# Patient Record
Sex: Female | Born: 1985 | Race: White | Hispanic: No | Marital: Married | Smoking: Former smoker
Health system: Southern US, Community
[De-identification: ages and names within clinical notes are randomized; demographics above are authoritative.]

## PROBLEM LIST (undated history)

## (undated) DIAGNOSIS — Z8614 Personal history of Methicillin resistant Staphylococcus aureus infection: Secondary | ICD-10-CM

## (undated) DIAGNOSIS — F329 Major depressive disorder, single episode, unspecified: Secondary | ICD-10-CM

## (undated) DIAGNOSIS — F32A Depression, unspecified: Secondary | ICD-10-CM

## (undated) DIAGNOSIS — F419 Anxiety disorder, unspecified: Secondary | ICD-10-CM

## (undated) DIAGNOSIS — D649 Anemia, unspecified: Secondary | ICD-10-CM

## (undated) DIAGNOSIS — J45909 Unspecified asthma, uncomplicated: Secondary | ICD-10-CM

## (undated) DIAGNOSIS — K219 Gastro-esophageal reflux disease without esophagitis: Secondary | ICD-10-CM

## (undated) HISTORY — PX: TUBAL LIGATION: SHX77

## (undated) HISTORY — PX: WISDOM TOOTH EXTRACTION: SHX21

---

## 2007-03-16 ENCOUNTER — Observation Stay: Payer: Self-pay | Admitting: Unknown Physician Specialty

## 2007-04-20 ENCOUNTER — Observation Stay: Payer: Self-pay | Admitting: Obstetrics and Gynecology

## 2007-06-23 ENCOUNTER — Observation Stay: Payer: Self-pay | Admitting: Obstetrics & Gynecology

## 2007-06-28 ENCOUNTER — Observation Stay: Payer: Self-pay | Admitting: Obstetrics and Gynecology

## 2007-07-05 ENCOUNTER — Observation Stay: Payer: Self-pay | Admitting: Obstetrics and Gynecology

## 2007-07-06 ENCOUNTER — Inpatient Hospital Stay: Payer: Self-pay | Admitting: Obstetrics and Gynecology

## 2008-02-22 ENCOUNTER — Emergency Department: Payer: Self-pay | Admitting: Emergency Medicine

## 2008-03-31 ENCOUNTER — Emergency Department: Payer: Self-pay | Admitting: Emergency Medicine

## 2009-11-20 ENCOUNTER — Ambulatory Visit: Payer: Self-pay | Admitting: Family Medicine

## 2010-03-19 ENCOUNTER — Observation Stay: Payer: Self-pay | Admitting: Obstetrics & Gynecology

## 2010-06-30 ENCOUNTER — Ambulatory Visit: Payer: Self-pay | Admitting: Obstetrics & Gynecology

## 2010-07-01 ENCOUNTER — Inpatient Hospital Stay: Payer: Self-pay | Admitting: Obstetrics & Gynecology

## 2010-09-15 ENCOUNTER — Ambulatory Visit: Payer: Self-pay | Admitting: Obstetrics & Gynecology

## 2010-09-23 ENCOUNTER — Ambulatory Visit: Payer: Self-pay | Admitting: Obstetrics & Gynecology

## 2017-02-11 ENCOUNTER — Emergency Department
Admission: EM | Admit: 2017-02-11 | Discharge: 2017-02-12 | Disposition: A | Discharge status: Home / Self Care | Payer: Self-pay | Attending: Emergency Medicine | Admitting: Emergency Medicine

## 2017-02-11 DIAGNOSIS — T782XXA Anaphylactic shock, unspecified, initial encounter: Secondary | ICD-10-CM | POA: Insufficient documentation

## 2017-02-11 MED ORDER — EPINEPHRINE 0.3 MG/0.3ML IJ SOAJ
INTRAMUSCULAR | Status: AC
  Filled 2017-02-11: qty 0.3

## 2017-02-11 MED ORDER — METHYLPREDNISOLONE SODIUM SUCC 125 MG IJ SOLR
INTRAMUSCULAR | Status: AC
  Filled 2017-02-11: qty 2

## 2017-02-11 MED ORDER — METHYLPREDNISOLONE SODIUM SUCC 125 MG IJ SOLR
125.0000 mg | Freq: Once | INTRAMUSCULAR | Status: AC
  Administered 2017-02-11: 125 mg via INTRAVENOUS

## 2017-02-11 MED ORDER — FAMOTIDINE IN NACL 20-0.9 MG/50ML-% IV SOLN
INTRAVENOUS | Status: AC
  Filled 2017-02-11: qty 50

## 2017-02-11 MED ORDER — DIPHENHYDRAMINE HCL 50 MG/ML IJ SOLN
INTRAMUSCULAR | Status: AC
  Filled 2017-02-11: qty 1

## 2017-02-11 MED ORDER — EPINEPHRINE 0.3 MG/0.3ML IJ SOAJ
0.3000 mg | Freq: Once | INTRAMUSCULAR | Status: AC
  Administered 2017-02-11: 0.3 mg via INTRAMUSCULAR

## 2017-02-11 MED ORDER — FAMOTIDINE IN NACL 20-0.9 MG/50ML-% IV SOLN
20.0000 mg | Freq: Once | INTRAVENOUS | Status: AC
  Administered 2017-02-11: 20 mg via INTRAVENOUS

## 2017-02-11 MED ORDER — DIPHENHYDRAMINE HCL 50 MG/ML IJ SOLN
25.0000 mg | Freq: Once | INTRAMUSCULAR | Status: AC
  Administered 2017-02-11: 25 mg via INTRAVENOUS

## 2017-02-11 NOTE — ED Triage Notes (Signed)
Pt arrives with redness, hives to entire body, hoarse throat, nausea, lip swelling. Pt immediately to room 6.

## 2017-02-11 NOTE — ED Notes (Signed)
Report to jenna, rn.  

## 2017-02-11 NOTE — ED Provider Notes (Signed)
[facility] Emergency Department Provider Note  ____________________________________________   First MD Initiated Contact with Patient 02/11/17 2256     (approximate)  I have reviewed the triage vital signs and the nursing notes.   HISTORY  Chief Complaint Allergic Reaction    HPI Amy Meza is a 31 y.o. female with no chronic medical history and no known allergies who presents for acute onset of severe generalized itching red rash with a course of voice, sensation of swelling of her tongue and throat, and mild shortness of breath.  The symptoms all started about 30 minutes prior to arrival and have rapidly gotten worse.  She is in moderate distress upon arrival, anxious, scratching her legs, cannot find a position of comfort, and with a clearly abnormal voice and slightly increased respiratory effort.  Nothing in particular makes the patient's symptoms better nor worse.  She had a single capsule of Benadryl by mouth prior to arrival on the way to the emergency department.  She has never had a reaction like this before.  She reports some nausea but no vomiting.  She denies abdominal pain and diarrhea.  She denies recent fever/chills, chest pain, and dysuria.  The symptoms all began when she went to her brother-in-law's house and was sitting on the floor.  The brother-in-law does have dogs, but she has never had a reaction like this before.   No past medical history on file.  There are no active problems to display for this patient.   No past surgical history on file.  Prior to Admission medications   Medication Sig Start Date End Date Taking? Authorizing Provider  EPINEPHrine (EPIPEN 2-PAK) 0.3 mg/0.3 mL IJ SOAJ injection Inject 0.3 mLs (0.3 mg total) into the muscle once. Take for severe allergic reaction, then come immediately to the Emergency Department or call 911. 02/12/17 02/12/17  Loleta Rose, MD  predniSONE (DELTASONE) 20 MG tablet Take 3 tablets  (60 mg total) by mouth daily. 02/12/17   Loleta Rose, MD    Allergies Patient has no allergy information on record.  No family history on file.  Social History Social History  Substance Use Topics  . Smoking status: Not on file  . Smokeless tobacco: Not on file  . Alcohol use Not on file    Review of Systems Constitutional: No fever/chills Eyes: No visual changes. ENT: Hoarse voice, sensation of swelling in her tongue and throat.  Mild difficulty swallowing. Cardiovascular: Denies chest pain. Respiratory: Mild shortness of breath. Gastrointestinal: No abdominal pain.  Nausea, no vomiting.  No diarrhea.  No constipation. Genitourinary: Negative for dysuria. Musculoskeletal: Negative for neck pain.  Negative for back pain. Integumentary: Negative for rash. Neurological: Negative for headaches, focal weakness or numbness.   ____________________________________________   PHYSICAL EXAM:  ED Triage Vitals  Enc Vitals Group     BP 02/11/17 2300 109/65     Pulse Rate 02/11/17 2300 82     Resp 02/11/17 2302 (!) 26     Temp --      Temp src --      SpO2 02/11/17 2300 98 %     Weight --      Height --      Head Circumference --      Peak Flow --      Pain Score --      Pain Loc --      Pain Edu? --      Excl. in GC? --  Constitutional: Alert and oriented. Well appearing and in no acute distress. Eyes: Conjunctivae are normal.  Head: Atraumatic. Nose: No congestion/rhinnorhea. Mouth/Throat: Mucous membranes are moist.  Oropharynx non-erythematous.  No obvious swelling, no oral lesions Neck: No stridor.  No meningeal signs.  Very obviously hoarse voice Cardiovascular: Normal rate, regular rhythm. Good peripheral circulation. Grossly normal heart sounds. Respiratory: Slightly increased respiratory effort.  No retractions. Lungs CTAB. Gastrointestinal: Soft and nontender. No distention.  Musculoskeletal: No lower extremity tenderness nor edema. No gross deformities  of extremities. Neurologic:  Normal speech and language. No gross focal neurologic deficits are appreciated.  Skin:  Generalized bright red rash with urticaria, extremely pruritic, patient is scratching at her legs primarily but the rash is present on all of her extremities and her torso. Psychiatric: Mood and affect are anxious. Speech and behavior are normal.  ____________________________________________   LABS (all labs ordered are listed, but only abnormal results are displayed)  Labs Reviewed - No data to display ____________________________________________  EKG  None - EKG not ordered by ED physician ____________________________________________  RADIOLOGY   No results found.  ____________________________________________   PROCEDURES  Critical Care performed: Yes, see critical care procedure note(s)   Procedure(s) performed:   .Critical Care Performed by: Loleta Rose Authorized by: Loleta Rose   Critical care provider statement:    Critical care time (minutes):  45   Critical care time was exclusive of:  Separately billable procedures and treating other patients   Critical care was necessary to treat or prevent imminent or life-threatening deterioration of the following conditions: anaphylaxis.   Critical care was time spent personally by me on the following activities:  Development of treatment plan with patient or surrogate, discussions with consultants, evaluation of patient's response to treatment, examination of patient, obtaining history from patient or surrogate, ordering and performing treatments and interventions, ordering and review of laboratory studies, ordering and review of radiographic studies, pulse oximetry, re-evaluation of patient's condition and review of old charts      ____________________________________________   INITIAL IMPRESSION / ASSESSMENT AND PLAN / ED COURSE     The patient presents meeting criteria for anaphylaxis.  I  came to the room immediately after I was informed of her presentation and when I heard her voice and description of symptoms I authorized administration of an EpiPen in her left thigh as well as Solu-Medrol 125 mg IV, Benadryl 25 mg IV, and famotidine 20 mg IV.  She was receptive cardiac monitor and pulse oximeter.  Within minutes of getting the EpiPen she started to feel better.  I informed her that we would need to observe her for about 5 hours and to let me know as soon as she started feeling worse.  I will also consider epinephrine nebulizer if her symptoms continue in terms of worsening voice and throat complications.  She is comfortable with the plan.  Differential diagnosis includes infectious causes and foodborne allergens, but this appears to be a fairly clear-cut case of anaphylaxis with an unknown allergen.  Clinical Course as of Feb 13 508  Sat Feb 11, 2017  2300 OBSERVATION CARE: This patient is being placed under observation care for the following reasons: Allergic reaction/anaphylaxis requiring treatment and serial exams to determine response to treatment   [CF]  2328 Patient is much improved with less agitation, less erythema, almost completely resolved urticaria, and improved speech although her voice is still worse compared to baseline according to her husband.  She states that she feels much better  and is no longer scratching at her legs and is resting comfortably.  We will continue to monitor.  I reiterated that we will need to observe her for at least 4-6 hours but more likely on the longer end of the range given the severity and acuity of her reaction.  She and her family understand and agree with the plan.  [CF]  2355 Patient is stable, breathing comfortably, talking with family  [CF]  Sun Feb 12, 2017  0118 Patient continues to feel better.  Breathing comfortably, no additional evidence of anaphylaxis  [CF]  0506 Patient has been resting comfortably.  She has no more rash and no  difficulty breathing.  Voice seems to have returned to normal.  She has been observed for 6 hours in the emergency department and I think it is appropriate to discharge her home at this time.  I gave my usual and customary return precautions and prescriptions for prednisone and EpiPens  [CF]  0507 END OF OBSERVATION STATUS: After an appropriate period of observation, this patient is being discharged due to the following reason(s):  No additional allergic/anaphylactic symptoms    [CF]    Clinical Course User Index [CF] Loleta Rose, MD    ____________________________________________  FINAL CLINICAL IMPRESSION(S) / ED DIAGNOSES  Final diagnoses:  Anaphylaxis, initial encounter     MEDICATIONS GIVEN DURING THIS VISIT:  Medications  diphenhydrAMINE (BENADRYL) injection 25 mg (25 mg Intravenous Given 02/11/17 2300)  methylPREDNISolone sodium succinate (SOLU-MEDROL) 125 mg/2 mL injection 125 mg (125 mg Intravenous Given 02/11/17 2300)  EPINEPHrine (EPI-PEN) injection 0.3 mg (0.3 mg Intramuscular Given 02/11/17 2258)  famotidine (PEPCID) IVPB 20 mg premix (0 mg Intravenous Stopped 02/11/17 2332)     NEW OUTPATIENT MEDICATIONS STARTED DURING THIS VISIT:  New Prescriptions   EPINEPHRINE (EPIPEN 2-PAK) 0.3 MG/0.3 ML IJ SOAJ INJECTION    Inject 0.3 mLs (0.3 mg total) into the muscle once. Take for severe allergic reaction, then come immediately to the Emergency Department or call 911.   PREDNISONE (DELTASONE) 20 MG TABLET    Take 3 tablets (60 mg total) by mouth daily.    Modified Medications   No medications on file    Discontinued Medications   No medications on file     Note:  This document was prepared using Dragon voice recognition software and may include unintentional dictation errors.    Loleta Rose, MD 02/12/17 [id number]

## 2017-02-12 MED ORDER — PREDNISONE 20 MG PO TABS: 60.0000 mg | ORAL_TABLET | Freq: Every day | ORAL | 0 refills | Status: DC

## 2017-02-12 MED ORDER — EPINEPHRINE 0.3 MG/0.3ML IJ SOAJ: 0.3000 mg | Freq: Once | INTRAMUSCULAR | 0 refills | Status: AC

## 2017-02-12 NOTE — ED Notes (Signed)
Patient reports that she was at a friends house and her hands began to itch. Patient then noticed redness to her chest and legs. Pt developed hives on bilateral legs, oral/lip swelling, throat tightness.  Patient denies any known allergens.

## 2017-02-12 NOTE — Discharge Instructions (Signed)

## 2017-02-12 NOTE — ED Notes (Signed)
Reviewed d/c instructions, follow-up care, prescriptions with patient. Pt verbalized understanding.  

## 2017-02-12 NOTE — ED Notes (Signed)
Patient reports only mild itching at this time.

## 2017-09-15 ENCOUNTER — Other Ambulatory Visit: Payer: Self-pay | Admitting: Physician Assistant

## 2017-09-15 DIAGNOSIS — R2231 Localized swelling, mass and lump, right upper limb: Secondary | ICD-10-CM

## 2017-09-18 ENCOUNTER — Ambulatory Visit
Admission: RE | Admit: 2017-09-18 | Discharge: 2017-09-18 | Disposition: A | Discharge status: Home / Self Care | Payer: BLUE CROSS/BLUE SHIELD | Source: Ambulatory Visit | Attending: Physician Assistant | Admitting: Physician Assistant

## 2017-09-18 DIAGNOSIS — R2231 Localized swelling, mass and lump, right upper limb: Secondary | ICD-10-CM | POA: Insufficient documentation

## 2018-06-11 ENCOUNTER — Other Ambulatory Visit: Payer: Self-pay | Admitting: Internal Medicine

## 2018-06-11 DIAGNOSIS — R2231 Localized swelling, mass and lump, right upper limb: Secondary | ICD-10-CM

## 2018-06-12 ENCOUNTER — Ambulatory Visit: Payer: Self-pay | Admitting: General Surgery

## 2018-06-12 NOTE — H&P (Signed)
PATIENT PROFILE: Amy Meza is a 33 y.o. female who presents to the Clinic for consultation at the request of Dr. Judithann SheenSparks for evaluation of right axillary mass.  PCP:  Marguarite ArbourSparks, Jeffrey D, MD  HISTORY OF PRESENT ILLNESS: [name] reports feeling an axillary mass since ~a year ago. The patient refers that the mass is tender upon palpation all the time. But if not touched is not painful. Mass is does painful during the her menstrual cycle. Menstrual cycle aggravates the pain. Pain radiates to the right upper extremity. Pain goes away by itself. Pain medication such as ibuprofen does not improve the pain. Patient refer pain is similar to breast pain during menstrual cycle. She does not has the same pain on the left axilla.   Patient has not have previous biopsy before. Patient has no family history of breast cancer. No other history of cancer in her family. She has had 2 pregnancies. No use of birth control pill or any hormone therapy. No history of chest radiation.    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 Positive for weight gain Allergy and Immunology ROS: negative for - hives  Hematological and Lymphatic ROS: negative for - bleeding problems or bruising, negative for palpable nodes Endocrine ROS: negative for - heat or cold intolerance, hair changes Respiratory ROS: negative for - cough, shortness of breath or wheezing Cardiovascular ROS: positive for chest pain and palpitations GI ROS: negative for nausea, vomiting, abdominal pain, diarrhea, constipation Musculoskeletal ROS: negative for - joint swelling or muscle pain. Pain in  the right upper extremity.  Neurological ROS: negative for - confusion, syncope. Positive for headache Dermatological ROS: negative for pruritus and rash Psychiatric: positive for anxiety, depression, difficulty sleeping and memory loss  MEDICATIONS: CurrentMedications        Current Outpatient Medications  Medication Sig Dispense Refill  . lysine 1,000 mg Tab Take by mouth as needed.     No current facility-administered medications for this visit.       ALLERGIES: Latex; Norco [hydrocodone-acetaminophen]; Others; and Sulfa (sulfonamide antibiotics)  PAST MEDICAL HISTORY:     Past Medical History:  Diagnosis Date  . High cholesterol   . 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 CESAREAN SECTION     x 2  . TUBAL LIGATION       FAMILY HISTORY: Family History  Problem Relation Age of Onset  . High blood pressure (Hypertension) Father   . High blood pressure (Hypertension) Paternal Grandfather   . Coronary Artery Disease (Blocked arteries around heart) Paternal Grandfather   . Diabetes type II Paternal Grandfather      SOCIAL HISTORY: Social History          Socioeconomic History  . Marital status: Married    Spouse name: Not on file  . Number of children: Not on file  . Years of education: Not on file  . Highest education level: Not on file  Occupational History  . Not on file  Social Needs  . Financial resource strain: Not on file  . Food insecurity:    Worry: Not on file    Inability: Not on file  . Transportation needs:    Medical: Not on file    Non-medical: Not on file  Tobacco Use  . Smoking status: Former Smoker    Packs/day: 1.00    Years: 13.00    Pack years: 13.00    Types: Cigarettes  . Smokeless tobacco: Never Used  . Tobacco comment: pt uses e-cig  Substance and Sexual Activity  . Alcohol use: Yes    Alcohol/week: 1.0  standard drinks    Types: 1 Glasses of wine per week  . Drug use: No  . Sexual activity: Not on file  Other Topics Concern  . Not on file  Social History Narrative   Married, lives with husband and daughter, born 2009 and son, born 2012. 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       PHYSICAL EXAM:    Vitals:   06/12/18 1430  BP: 111/70  Pulse: 87   Body mass index is 24.42 kg/m. Weight: 67.6 kg (149 lb 0.5 oz)   GENERAL: Alert, active, oriented x3  HEENT: Pupils equal reactive to light. Extraocular movements are intact. Sclera clear. Palpebral conjunctiva normal red color.Pharynx clear.  NECK: Supple with no palpable mass and no adenopathy.  LUNGS: Sound clear with no rales rhonchi or wheezes.  HEART: Regular rhythm S1 and S2 without murmur.  Breasts: breasts appear normal, no suspicious masses, no skin or nipple changes or axillary nodes. There is a palpable lump in the right axilla, tender to palpation, rubbery, mobile.   ABDOMEN: Soft and depressible, nontender with no palpable mass, no hepatomegaly. Wounds dry and clean.  EXTREMITIES: Well-developed well-nourished symmetrical with no dependent edema.  NEUROLOGICAL: Awake alert oriented, facial expression symmetrical, moving all extremities.  REVIEW OF DATA: I have reviewed the following data today:      No visits with results within 3 Month(s) from this visit.  Latest known visit with results is:  Hospital Outpatient Visit on 02/13/2018  Component Date Value  . Vent Rate (bpm) 02/13/2018 56   . PR Interval (msec) 02/13/2018 150   . QRS Interval (msec) 02/13/2018 96   . QT Interval (msec) 02/13/2018 428   . QTc (msec) 02/13/2018 413      ASSESSMENT: [name] is a 33 y.o. female presenting for consultation for right axillary mass with pain.  Patient with a right axillary mass that feels like lipomatous etiology. Due to the pain that is increased during her menstrual period, it sounds like it is an  ectopic breast tissue. Patient was oriented about the differential diagnosis of benign lipoma vs ectopic breast tissue. I favor breast ectopic tissue to the the pain increased with menstrual periods. Patient oriented about the surgical alternative of removing the lump. The goal of the surgery is to improve her pain. Patient oriented that she can continue with pain if it is associated with the menstrual period, but without a mass, another alternatives of treatment will need to be explored.   Patient risk of having breast cancer in the next five years is 0.2% as per The Breast Cancer Risk Assessment Tool by the [facility].   Patient refers understood and want to proceed with excision of the right axillary mass.   Axillary mass, right [R22.31]  PLAN: 1. Excision of right axillary mass (19120) 2. CBC, CMP 3. Avoid taking aspirin 5 days  before surgery 4. Contact us if has any question or concern.   Patient verbalized understanding, all questions were answered, and were agreeable with the plan outlined above.     Carolan Shiver, MD  Electronically signed by Carolan Shiver, MD

## 2018-06-12 NOTE — H&P (View-Only) (Signed)
PATIENT PROFILE: Amy Meza is a 33 y.o. female who presents to the Clinic for consultation at the request of Dr. Judithann Meza for evaluation of right axillary mass.  PCP:  Amy Meza, Amy D, MD  HISTORY OF PRESENT ILLNESS: Ms. Amy Meza reports feeling an axillary mass since ~a year ago. The patient refers that the mass is tender upon palpation all the time. But if not touched is not painful. Mass is does painful during the her menstrual cycle. Menstrual cycle aggravates the pain. Pain radiates to the right upper extremity. Pain goes away by itself. Pain medication such as ibuprofen does not improve the pain. Patient refer pain is similar to breast pain during menstrual cycle. She does not has the same pain on the left axilla.   Patient has not have previous biopsy before. Patient has no family history of breast cancer. No other history of cancer in her family. She has had 2 pregnancies. No use of birth control pill or any hormone therapy. No history of chest radiation.    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 Positive for weight gain Allergy and Immunology ROS: negative for - hives  Hematological and Lymphatic ROS: negative for - bleeding problems or bruising, negative for palpable nodes Endocrine ROS: negative for - heat or cold intolerance, hair changes Respiratory ROS: negative for - cough, shortness of breath or wheezing Cardiovascular ROS: positive for chest pain and palpitations GI ROS: negative for nausea, vomiting, abdominal pain, diarrhea, constipation Musculoskeletal ROS: negative for - joint swelling or muscle pain. Pain in  the right upper extremity.  Neurological ROS: negative for - confusion, syncope. Positive for headache Dermatological ROS: negative for pruritus and rash Psychiatric: positive for anxiety, depression, difficulty sleeping and memory loss  MEDICATIONS: CurrentMedications        Current Outpatient Medications  Medication Sig Dispense Refill  . lysine 1,000 mg Tab Take by mouth as needed.     No current facility-administered medications for this visit.       ALLERGIES: Latex; Norco [hydrocodone-acetaminophen]; Others; and Sulfa (sulfonamide antibiotics)  PAST MEDICAL HISTORY:     Past Medical History:  Diagnosis Date  . High cholesterol   . 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 CESAREAN SECTION     x 2  . TUBAL LIGATION       FAMILY HISTORY: Family History  Problem Relation Age of Onset  . High blood pressure (Hypertension) Father   . High blood pressure (Hypertension) Paternal Grandfather   . Coronary Artery Disease (Blocked arteries around heart) Paternal Grandfather   . Diabetes type II Paternal Grandfather      SOCIAL HISTORY: Social History          Socioeconomic History  . Marital status: Married    Spouse name: Not on file  . Number of children: Not on file  . Years of education: Not on file  . Highest education level: Not on file  Occupational History  . Not on file  Social Needs  . Financial resource strain: Not on file  . Food insecurity:    Worry: Not on file    Inability: Not on file  . Transportation needs:    Medical: Not on file    Non-medical: Not on file  Tobacco Use  . Smoking status: Former Smoker    Packs/day: 1.00    Years: 13.00    Pack years: 13.00    Types: Cigarettes  . Smokeless tobacco: Never Used  . Tobacco comment: pt uses e-cig  Substance and Sexual Activity  . Alcohol use: Yes    Alcohol/week: 1.0  standard drinks    Types: 1 Glasses of wine per week  . Drug use: No  . Sexual activity: Not on file  Other Topics Concern  . Not on file  Social History Narrative   Married, lives with husband and daughter, born 2009 and son, born 2012. 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       PHYSICAL EXAM:    Vitals:   06/12/18 1430  BP: 111/70  Pulse: 87   Body mass index is 24.42 kg/m. Weight: 67.6 kg (149 lb 0.5 oz)   GENERAL: Alert, active, oriented x3  HEENT: Pupils equal reactive to light. Extraocular movements are intact. Sclera clear. Palpebral conjunctiva normal red color.Pharynx clear.  NECK: Supple with no palpable mass and no adenopathy.  LUNGS: Sound clear with no rales rhonchi or wheezes.  HEART: Regular rhythm S1 and S2 without murmur.  Breasts: breasts appear normal, no suspicious masses, no skin or nipple changes or axillary nodes. There is a palpable lump in the right axilla, tender to palpation, rubbery, mobile.   ABDOMEN: Soft and depressible, nontender with no palpable mass, no hepatomegaly. Wounds dry and clean.  EXTREMITIES: Well-developed well-nourished symmetrical with no dependent edema.  NEUROLOGICAL: Awake alert oriented, facial expression symmetrical, moving all extremities.  REVIEW OF DATA: I have reviewed the following data today:      No visits with results within 3 Month(s) from this visit.  Latest known visit with results is:  Hospital Outpatient Visit on 02/13/2018  Component Date Value  . Vent Rate (bpm) 02/13/2018 56   . PR Interval (msec) 02/13/2018 150   . QRS Interval (msec) 02/13/2018 96   . QT Interval (msec) 02/13/2018 428   . QTc (msec) 02/13/2018 413      ASSESSMENT: [name] is a 32 y.o. female presenting for consultation for right axillary mass with pain.  Patient with a right axillary mass that feels like lipomatous etiology. Due to the pain that is increased during her menstrual period, it sounds like it is an  ectopic breast tissue. Patient was oriented about the differential diagnosis of benign lipoma vs ectopic breast tissue. I favor breast ectopic tissue to the the pain increased with menstrual periods. Patient oriented about the surgical alternative of removing the lump. The goal of the surgery is to improve her pain. Patient oriented that she can continue with pain if it is associated with the menstrual period, but without a mass, another alternatives of treatment will need to be explored.   Patient risk of having breast cancer in the next five years is 0.2% as per The Breast Cancer Risk Assessment Tool by the National Cancer Institute.   Patient refers understood and want to proceed with excision of the right axillary mass.   Axillary mass, right [R22.31]  PLAN: 1. Excision of right axillary mass (19120) 2. CBC, CMP 3. Avoid taking aspirin 5 days   before surgery 4. Contact us if has any question or concern.   Patient verbalized understanding, all questions were answered, and were agreeable with the plan outlined above.     Carolan Shiver, MD  Electronically signed by Carolan Shiver, MD

## 2018-06-18 ENCOUNTER — Other Ambulatory Visit: Payer: Self-pay

## 2018-06-18 ENCOUNTER — Encounter
Admission: RE | Admit: 2018-06-18 | Discharge: 2018-06-18 | Disposition: A | Discharge status: Home / Self Care | Payer: BLUE CROSS/BLUE SHIELD | Source: Ambulatory Visit | Attending: General Surgery | Admitting: General Surgery

## 2018-06-18 HISTORY — DX: Personal history of Methicillin resistant Staphylococcus aureus infection: Z86.14

## 2018-06-18 HISTORY — DX: Gastro-esophageal reflux disease without esophagitis: K21.9

## 2018-06-18 HISTORY — DX: Anxiety disorder, unspecified: F41.9

## 2018-06-18 HISTORY — DX: Depression, unspecified: F32.A

## 2018-06-18 HISTORY — DX: Major depressive disorder, single episode, unspecified: F32.9

## 2018-06-18 NOTE — Patient Instructions (Signed)
Your procedure is scheduled on: 06-20-18  Report to Same Day Surgery 2nd floor medical mall [facility](Medical Mall Entrance-take elevator on left to 2nd floor.  Check in with surgery information desk.) To find out your arrival time please call [phone] between 1PM - 3PM on 06-19-18  Remember: Instructions that are not followed completely may result in serious medical risk, up to and including death, or upon the discretion of your surgeon and anesthesiologist your surgery may need to be rescheduled.    _x___ 1. Do not eat food after midnight the night before your procedure. You may drink clear liquids up to 2 hours before you are scheduled to arrive at the hospital for your procedure.  Do not drink clear liquids within 2 hours of your scheduled arrival to the hospital.  Clear liquids include  --Water or Apple juice without pulp  --Clear carbohydrate beverage such as ClearFast or Gatorade  --Black Coffee or Clear Tea (No milk, no creamers, do not add anything to the coffee or Tea   ____Ensure clear carbohydrate drink on the way to the hospital for bariatric patients  ____Ensure clear carbohydrate drink 3 hours before surgery for Dr Rutherford NailByrnett's patients if physician instructed.   No gum chewing or hard candies.     __x__ 2. No Alcohol for 24 hours before or after surgery.   __x__3. No Smoking or e-cigarettes for 24 prior to surgery.  Do not use any chewable tobacco products for at least 6 hour prior to surgery   ____  4. Bring all medications with you on the day of surgery if instructed.    __x__ 5. Notify your doctor if there is any change in your medical condition     (cold, fever, infections).    x___6. On the morning of surgery brush your teeth with toothpaste and water.  You may rinse your mouth with mouth wash if you wish.  Do not swallow any toothpaste or mouthwash.   Do not wear jewelry, make-up, hairpins, clips or nail polish.  Do not wear lotions, powders, or perfumes. You may wear  deodorant.  Do not shave 48 hours prior to surgery. Men may shave face and neck.  Do not bring valuables to the hospital.    [facility] any belongings or valuables.               Contacts, dentures or bridgework may not be worn into surgery.  Leave your suitcase in the car. After surgery it may be brought to your room.  For patients admitted to the hospital, discharge time is determined by your treatment team.  _  Patients discharged the day of surgery will not be allowed to drive home.  You will need someone to drive you home and stay with you the night of your procedure.    Please read over the following fact sheets that you were given:   [facility] Preparing [facility]  ____ Take anti-hypertensive listed below, cardiac, seizure, asthma, anti-reflux and psychiatric medicines. These include:  1. NONE  2.  3.  4.  5.  6.  ____Fleets enema or Magnesium Citrate as directed.   ____ Use CHG Soap or sage wipes as directed on instruction sheet   ____ Use inhalers on the day of surgery and bring to hospital day of surgery  ____ Stop Metformin and Janumet 2 days prior to surgery.    ____ Take 1/2 of usual insulin dose the night before surgery and none on  the morning     surgery.   ____ Follow recommendations from Cardiologist, Pulmonologist or PCP regarding stopping Aspirin, Coumadin, Plavix ,Eliquis, Effient, or Pradaxa, and Pletal.  X____Stop Anti-inflammatories such as Advil, Aleve, Ibuprofen, Motrin, Naproxen, Naprosyn, Goodies powders or aspirin products NOW- OK to take Tylenol    _x___ Stop supplements until after surgery-STOP AIRBORNE AND LYSINE NOW-MAY RESUME AFTER SURGERY   ____ Bring C-Pap to the hospital.

## 2018-06-19 MED ORDER — CEFAZOLIN SODIUM-DEXTROSE 2-4 GM/100ML-% IV SOLN
2.0000 g | INTRAVENOUS | Status: AC
  Administered 2018-06-20: 2 g via INTRAVENOUS

## 2018-06-20 ENCOUNTER — Other Ambulatory Visit: Payer: Self-pay

## 2018-06-20 ENCOUNTER — Ambulatory Visit: Payer: BLUE CROSS/BLUE SHIELD | Admitting: Anesthesiology

## 2018-06-20 ENCOUNTER — Encounter
Admission: RE | Disposition: A | Discharge status: Home / Self Care | Payer: Self-pay | Source: Home / Self Care | Attending: General Surgery

## 2018-06-20 ENCOUNTER — Ambulatory Visit
Admission: RE | Admit: 2018-06-20 | Discharge: 2018-06-20 | Disposition: A | Discharge status: Home / Self Care | Payer: BLUE CROSS/BLUE SHIELD | Attending: General Surgery | Admitting: General Surgery

## 2018-06-20 ENCOUNTER — Encounter: Payer: Self-pay | Admitting: *Deleted

## 2018-06-20 DIAGNOSIS — R222 Localized swelling, mass and lump, trunk: Secondary | ICD-10-CM | POA: Diagnosis present

## 2018-06-20 DIAGNOSIS — K219 Gastro-esophageal reflux disease without esophagitis: Secondary | ICD-10-CM | POA: Diagnosis not present

## 2018-06-20 DIAGNOSIS — Z87891 Personal history of nicotine dependence: Secondary | ICD-10-CM | POA: Insufficient documentation

## 2018-06-20 HISTORY — PX: MASS EXCISION: SHX2000

## 2018-06-20 LAB — POCT PREGNANCY, URINE: Preg Test, Ur: NEGATIVE

## 2018-06-20 SURGERY — MINOR EXCISION OF MASS
Anesthesia: General | Laterality: Right

## 2018-06-20 MED ORDER — DEXAMETHASONE SODIUM PHOSPHATE 10 MG/ML IJ SOLN
INTRAMUSCULAR | Status: DC | PRN
  Administered 2018-06-20: 10 mg via INTRAVENOUS

## 2018-06-20 MED ORDER — EPINEPHRINE PF 1 MG/ML IJ SOLN
INTRAMUSCULAR | Status: AC
  Filled 2018-06-20: qty 1

## 2018-06-20 MED ORDER — NABUMETONE 500 MG PO TABS: 500.0000 mg | ORAL_TABLET | Freq: Two times a day (BID) | ORAL | 0 refills | Status: AC

## 2018-06-20 MED ORDER — SEVOFLURANE IN SOLN
RESPIRATORY_TRACT | Status: AC
  Filled 2018-06-20: qty 250

## 2018-06-20 MED ORDER — BUPIVACAINE-EPINEPHRINE 0.5% -1:200000 IJ SOLN
INTRAMUSCULAR | Status: DC | PRN
  Administered 2018-06-20: 8 mL
  Administered 2018-06-20: 12 mL

## 2018-06-20 MED ORDER — FENTANYL CITRATE (PF) 100 MCG/2ML IJ SOLN: 25.0000 ug | INTRAMUSCULAR | Status: DC | PRN

## 2018-06-20 MED ORDER — DEXMEDETOMIDINE HCL 200 MCG/2ML IV SOLN
INTRAVENOUS | Status: DC | PRN
  Administered 2018-06-20: 8 ug via INTRAVENOUS

## 2018-06-20 MED ORDER — PROPOFOL 10 MG/ML IV BOLUS
INTRAVENOUS | Status: AC
  Filled 2018-06-20: qty 40

## 2018-06-20 MED ORDER — ONDANSETRON HCL 4 MG/2ML IJ SOLN
INTRAMUSCULAR | Status: AC
  Filled 2018-06-20: qty 2

## 2018-06-20 MED ORDER — FENTANYL CITRATE (PF) 100 MCG/2ML IJ SOLN
INTRAMUSCULAR | Status: AC
  Filled 2018-06-20: qty 2

## 2018-06-20 MED ORDER — LACTATED RINGERS IV SOLN
INTRAVENOUS | Status: DC
  Administered 2018-06-20: 12:00:00 via INTRAVENOUS

## 2018-06-20 MED ORDER — HYDROCODONE-ACETAMINOPHEN 5-325 MG PO TABS
ORAL_TABLET | ORAL | Status: AC
  Filled 2018-06-20: qty 1

## 2018-06-20 MED ORDER — HYDROCODONE-ACETAMINOPHEN 5-325 MG PO TABS: 1.0000 | ORAL_TABLET | Freq: Four times a day (QID) | ORAL | 0 refills | Status: DC | PRN

## 2018-06-20 MED ORDER — GABAPENTIN 300 MG PO CAPS: 300.0000 mg | ORAL_CAPSULE | Freq: Three times a day (TID) | ORAL | 0 refills | Status: DC

## 2018-06-20 MED ORDER — HYDROCODONE-ACETAMINOPHEN 5-325 MG PO TABS
1.0000 | ORAL_TABLET | Freq: Four times a day (QID) | ORAL | Status: DC | PRN
  Administered 2018-06-20: 1 via ORAL

## 2018-06-20 MED ORDER — EPHEDRINE SULFATE 50 MG/ML IJ SOLN
INTRAMUSCULAR | Status: DC | PRN
  Administered 2018-06-20 (×2): 5 mg via INTRAVENOUS

## 2018-06-20 MED ORDER — FAMOTIDINE 20 MG PO TABS
20.0000 mg | ORAL_TABLET | Freq: Once | ORAL | Status: AC
  Administered 2018-06-20: 20 mg via ORAL

## 2018-06-20 MED ORDER — FAMOTIDINE 20 MG PO TABS
ORAL_TABLET | ORAL | Status: AC
  Filled 2018-06-20: qty 1

## 2018-06-20 MED ORDER — LIDOCAINE HCL (PF) 2 % IJ SOLN
INTRAMUSCULAR | Status: AC
  Filled 2018-06-20: qty 10

## 2018-06-20 MED ORDER — ONDANSETRON HCL 4 MG/2ML IJ SOLN
INTRAMUSCULAR | Status: DC | PRN
  Administered 2018-06-20: 4 mg via INTRAVENOUS

## 2018-06-20 MED ORDER — BUPIVACAINE HCL (PF) 0.5 % IJ SOLN
INTRAMUSCULAR | Status: AC
  Filled 2018-06-20: qty 30

## 2018-06-20 MED ORDER — MIDAZOLAM HCL 2 MG/2ML IJ SOLN
INTRAMUSCULAR | Status: DC | PRN
  Administered 2018-06-20: 2 mg via INTRAVENOUS

## 2018-06-20 MED ORDER — EPHEDRINE SULFATE 50 MG/ML IJ SOLN
INTRAMUSCULAR | Status: AC
  Filled 2018-06-20: qty 1

## 2018-06-20 MED ORDER — DEXAMETHASONE SODIUM PHOSPHATE 10 MG/ML IJ SOLN
INTRAMUSCULAR | Status: AC
  Filled 2018-06-20: qty 1

## 2018-06-20 MED ORDER — PROPOFOL 10 MG/ML IV BOLUS
INTRAVENOUS | Status: DC | PRN
  Administered 2018-06-20: 130 mg via INTRAVENOUS

## 2018-06-20 MED ORDER — LIDOCAINE HCL (CARDIAC) PF 100 MG/5ML IV SOSY
PREFILLED_SYRINGE | INTRAVENOUS | Status: DC | PRN
  Administered 2018-06-20: 80 mg via INTRAVENOUS

## 2018-06-20 MED ORDER — CEFAZOLIN SODIUM-DEXTROSE 2-4 GM/100ML-% IV SOLN
INTRAVENOUS | Status: AC
  Filled 2018-06-20: qty 100

## 2018-06-20 MED ORDER — MIDAZOLAM HCL 2 MG/2ML IJ SOLN
INTRAMUSCULAR | Status: AC
  Filled 2018-06-20: qty 2

## 2018-06-20 MED ORDER — FENTANYL CITRATE (PF) 100 MCG/2ML IJ SOLN
INTRAMUSCULAR | Status: DC | PRN
  Administered 2018-06-20 (×3): 50 ug via INTRAVENOUS
  Administered 2018-06-20 (×2): 25 ug via INTRAVENOUS

## 2018-06-20 SURGICAL SUPPLY — 31 items
BLADE SURG 15 STRL LF DISP TIS (BLADE) ×1 IMPLANT
BLADE SURG 15 STRL SS (BLADE) ×2
CHLORAPREP W/TINT 26ML (MISCELLANEOUS) ×3 IMPLANT
CNTNR SPEC 2.5X3XGRAD LEK (MISCELLANEOUS) ×1
CONT SPEC 4OZ STER OR WHT (MISCELLANEOUS) ×2
CONTAINER SPEC 2.5X3XGRAD LEK (MISCELLANEOUS) ×1 IMPLANT
COVER WAND RF STERILE (DRAPES) ×3 IMPLANT
DERMABOND ADVANCED (GAUZE/BANDAGES/DRESSINGS) ×2
DERMABOND ADVANCED .7 DNX12 (GAUZE/BANDAGES/DRESSINGS) ×1 IMPLANT
DRAPE LAPAROTOMY 100X77 ABD (DRAPES) ×3 IMPLANT
DRAPE UNDER BUTTOCK W/FLU (DRAPES) IMPLANT
ELECT REM PT RETURN 9FT ADLT (ELECTROSURGICAL) ×3
ELECTRODE REM PT RTRN 9FT ADLT (ELECTROSURGICAL) ×1 IMPLANT
GLOVE BIO SURGEON STRL SZ 6.5 (GLOVE) ×2 IMPLANT
GLOVE BIO SURGEONS STRL SZ 6.5 (GLOVE) ×1
GLOVE BIOGEL PI IND STRL 6.5 (GLOVE) ×1 IMPLANT
GLOVE BIOGEL PI INDICATOR 6.5 (GLOVE) ×2
KIT TURNOVER KIT A (KITS) ×3 IMPLANT
LABEL OR SOLS (LABEL) ×3 IMPLANT
MARGIN MAP 10MM (MISCELLANEOUS) ×3 IMPLANT
NEEDLE HYPO 25X1 1.5 SAFETY (NEEDLE) ×3 IMPLANT
NS IRRIG 500ML POUR BTL (IV SOLUTION) ×3 IMPLANT
PACK BASIN MINOR ARMC (MISCELLANEOUS) ×3 IMPLANT
SUT ETHILON 3-0 (SUTURE) ×3 IMPLANT
SUT MNCRL 4-0 (SUTURE) ×2
SUT MNCRL 4-0 27XMFL (SUTURE) ×1
SUT VIC AB 2-0 SH 27 (SUTURE) ×4
SUT VIC AB 2-0 SH 27XBRD (SUTURE) ×2 IMPLANT
SUTURE MNCRL 4-0 27XMF (SUTURE) ×1 IMPLANT
SYR 10ML LL (SYRINGE) ×3 IMPLANT
SYR 20CC LL (SYRINGE) ×3 IMPLANT

## 2018-06-20 NOTE — Op Note (Signed)
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  Postoperative diagnosis: Right axillary ectopic breast tissue. .  Procedure: Excision of right axillary breast tissue.   Anesthesia: GETA  Surgeon: Dr. Hazle Quant  Wound Classification: Clean  Indications:  Patient is a 33 y.o. female with a palpable right  Axillary mass. The mass was painful especially during menstrual period. Mass also tender to palpation. Excision indicated due to pain.   Findings: 1. Palpable mass at right axillary area.  2. No palpable nodes or other masses.  3. Adequate hemostasis.   Description of procedure: The patient was taken to the operating room and placed supine on the operating table with the right arm hyperextended to expose the axilla and after general anesthesia was administered, the right axilla were prepped and draped in the usual sterile fashion. A time-out was completed verifying correct patient, procedure, site, positioning, and implant(s) and/or special equipment prior to beginning this procedure.  A circumareolar skin incision incision was planned adjacent to the palpable mass. Local anesthesia was infiltrated and a skin incision was made. Flaps were raised and the location of the mass confirmed by palpation.   The mass was dissected from surrounding tissues using cautery. After removing the lump, the cavity was palpated and no additional abnormalities was palpated. The specimen was submitted to pathology.  Hemostasis was achieved with electrocautery and suture ligatures of 2-0 Vicryl. Dead tissue was approximated with 2-0 Vicryl. The subcutaneous tissue immediately under the skin was approximated with interrupted 2-0 Vicryl sutures. The skin was closed with a subcuticular suture of running monocryl 4-0. Dermabond was applied.  The patient tolerated the procedure well and was taken to the postanesthesia care unit in stable condition.   Specimen: Right axillary mass  Complications:  None  Estimated Blood Loss: 5 mL

## 2018-06-20 NOTE — Interval H&P Note (Signed)
History and Physical Interval Note:  06/20/2018 11:31 AM  Amy Meza  has presented today for surgery, with the diagnosis of AXILLARY MASS RIGHT  R22.3  The various methods of treatment have been discussed with the patient and family. After consideration of risks, benefits and other options for treatment, the patient has consented to  Procedure(s): EXCISION OF AXILLARY MASS (Right) as a surgical intervention .  The patient's history has been reviewed, patient examined, no change in status, stable for surgery.  I have reviewed the patient's chart and labs.  Right axilla marked in the pre procedure room. Questions were answered to the patient's satisfaction.     Carolan Shiver

## 2018-06-20 NOTE — Anesthesia Post-op Follow-up Note (Signed)
Anesthesia QCDR form completed.        

## 2018-06-20 NOTE — Transfer of Care (Signed)
Immediate Anesthesia Transfer of Care Note  Patient: Amy Meza  Procedure(s) Performed: EXCISION OF AXILLARY MASS (Right )  Patient Location: PACU  Anesthesia Type:General  Level of Consciousness: awake  Airway & Oxygen Therapy: Patient Spontanous Breathing and Patient connected to nasal cannula oxygen  Post-op Assessment: Report given to RN and Post -op Vital signs reviewed and stable  Post vital signs: stable  Last Vitals:  Vitals Value Taken Time  BP 106/69 06/20/2018  1:09 PM  Temp 36.1 C 06/20/2018  1:09 PM  Pulse 103 06/20/2018  1:13 PM  Resp 22 06/20/2018  1:13 PM  SpO2 100 % 06/20/2018  1:13 PM  Vitals shown include unvalidated device data.  Last Pain:  Vitals:   06/20/18 1313  TempSrc:   PainSc: 1          Complications: No apparent anesthesia complications

## 2018-06-20 NOTE — Discharge Instructions (Signed)
AMBULATORY SURGERY  DISCHARGE INSTRUCTIONS   1) The drugs that you were given will stay in your system until tomorrow so for the next 24 hours you should not:  A) Drive an automobile B) Make any legal decisions C) Drink any alcoholic beverage   2) You may resume regular meals tomorrow.  Today it is better to start with liquids and gradually work up to solid foods.  You may eat anything you prefer, but it is better to start with liquids, then soup and crackers, and gradually work up to solid foods.   3) Please notify your doctor immediately if you have any unusual bleeding, trouble breathing, redness and pain at the surgery site, drainage, fever, or pain not relieved by medication.    4) Additional Instructions:        Please contact your physician with any problems or Same Day Surgery at [phone], Monday through Friday 6 am to 4 pm, or  at [facility] number at [phone]. Diet: Resume home heart healthy regular diet.   Activity: No heavy lifting >20 pounds (children, pets, laundry, garbage) or strenuous activity until follow-up, but light activity and walking are encouraged. Do not drive or drink alcohol if taking narcotic pain medications.  Wound care: May shower with soapy water and pat dry (do not rub incisions), but no baths or submerging incision underwater until follow-up. (no swimming)   Medications: Resume all home medications. For mild to moderate pain: acetaminophen (Tylenol) or ibuprofen (if no kidney disease). Combining Tylenol with alcohol can substantially increase your risk of causing liver disease. Narcotic pain medications, if prescribed, can be used for severe pain, though may cause nausea, constipation, and drowsiness. Do not combine Tylenol and Norco within a 6 hour period as Norco contains Tylenol. If you do not need the narcotic pain medication, you do not need to fill the prescription.  Should use the Gabapentin and the Nabumetone  regularly as prescribed. Vicodin should be used for breakthrough pain.   Can use ice pack or warm compresses for pain and swelling.   Call office [phone]) at any time if any questions, worsening pain, fevers/chills, bleeding, drainage from incision site, or other concerns.

## 2018-06-20 NOTE — Anesthesia Postprocedure Evaluation (Signed)
Anesthesia Post Note  Patient: Amy Meza  Procedure(s) Performed: EXCISION OF AXILLARY MASS (Right )  Patient location during evaluation: PACU Anesthesia Type: General Level of consciousness: awake and alert Pain management: pain level controlled Vital Signs Assessment: post-procedure vital signs reviewed and stable Respiratory status: spontaneous breathing, nonlabored ventilation, respiratory function stable and patient connected to nasal cannula oxygen Cardiovascular status: blood pressure returned to baseline and stable Postop Assessment: no apparent nausea or vomiting Anesthetic complications: no     Last Vitals:  Vitals:   06/20/18 1340 06/20/18 1358  BP: 111/79 118/77  Pulse: 88 80  Resp: 19 20  Temp:  36.7 C  SpO2: 97% 99%    Last Pain:  Vitals:   06/20/18 1358  TempSrc: Temporal  PainSc: 4                  Cleda Mccreedy Piscitello

## 2018-06-20 NOTE — Anesthesia Procedure Notes (Signed)
Procedure Name: LMA Insertion Date/Time: 06/20/2018 11:53 AM Performed by: Oletta Lamas, CRNA Pre-anesthesia Checklist: Patient identified, Emergency Drugs available, Suction available, Patient being monitored and Timeout performed Patient Re-evaluated:Patient Re-evaluated prior to induction Oxygen Delivery Method: Circle system utilized Preoxygenation: Pre-oxygenation with 100% oxygen Induction Type: IV induction Ventilation: Mask ventilation without difficulty LMA: LMA inserted LMA Size: 4.0 Number of attempts: 1 Tube secured with: Tape Dental Injury: Teeth and Oropharynx as per pre-operative assessment

## 2018-06-20 NOTE — Anesthesia Preprocedure Evaluation (Signed)
Anesthesia Evaluation  Patient identified by MRN, date of birth, ID band Patient awake    Reviewed: Allergy & Precautions, H&P , NPO status , Patient's Chart, lab work & pertinent test results  History of Anesthesia Complications Negative for: history of anesthetic complications  Airway Mallampati: II  TM Distance: >3 FB Neck ROM: full    Dental  (+) Chipped   Pulmonary neg shortness of breath, former smoker,           Cardiovascular Exercise Tolerance: Good (-) angina(-) Past MI and (-) DOE negative cardio ROS       Neuro/Psych negative neurological ROS  negative psych ROS   GI/Hepatic Neg liver ROS, GERD  Medicated and Controlled,  Endo/Other  negative endocrine ROS  Renal/GU      Musculoskeletal   Abdominal   Peds  Hematology negative hematology ROS (+)   Anesthesia Other Findings Past Medical History: No date: Anxiety     Comment:  HAS BEEN OFF MEDS SINCE SEPT 2019 No date: Depression     Comment:  HAS BEEN OFF MEDS SINCE SEPT 2019 No date: GERD (gastroesophageal reflux disease)     Comment:  OCC-NO MEDS No date: History of methicillin resistant staphylococcus aureus (MRSA)     Comment:  AGE 33 OR 19  Past Surgical History: No date: CESAREAN SECTION     Comment:  X2 No date: TUBAL LIGATION No date: WISDOM TOOTH EXTRACTION  BMI    Body Mass Index:  24.79 kg/m      Reproductive/Obstetrics negative OB ROS                             Anesthesia Physical Anesthesia Plan  ASA: III  Anesthesia Plan: General LMA   Post-op Pain Management:    Induction: Intravenous  PONV Risk Score and Plan: Dexamethasone, Ondansetron, Midazolam and Treatment may vary due to age or medical condition  Airway Management Planned: LMA  Additional Equipment:   Intra-op Plan:   Post-operative Plan: Extubation in OR  Informed Consent: I have reviewed the patients History and Physical,  chart, labs and discussed the procedure including the risks, benefits and alternatives for the proposed anesthesia with the patient or authorized representative who has indicated his/her understanding and acceptance.     Dental Advisory Given  Plan Discussed with: Anesthesiologist, CRNA and Surgeon  Anesthesia Plan Comments: (Patient consented for risks of anesthesia including but not limited to:  - adverse reactions to medications - damage to teeth, lips or other oral mucosa - sore throat or hoarseness - Damage to heart, brain, lungs or loss of life  Patient voiced understanding.)        Anesthesia Quick Evaluation

## 2018-06-21 LAB — SURGICAL PATHOLOGY

## 2019-05-23 ENCOUNTER — Other Ambulatory Visit: Payer: Self-pay

## 2019-05-23 ENCOUNTER — Emergency Department: Payer: BC Managed Care – PPO

## 2019-05-23 ENCOUNTER — Emergency Department
Admission: EM | Admit: 2019-05-23 | Discharge: 2019-05-23 | Disposition: A | Discharge status: Home / Self Care | Payer: BC Managed Care – PPO | Attending: Emergency Medicine | Admitting: Emergency Medicine

## 2019-05-23 ENCOUNTER — Encounter: Payer: Self-pay | Admitting: Emergency Medicine

## 2019-05-23 DIAGNOSIS — S99912A Unspecified injury of left ankle, initial encounter: Secondary | ICD-10-CM | POA: Diagnosis present

## 2019-05-23 DIAGNOSIS — S93492A Sprain of other ligament of left ankle, initial encounter: Secondary | ICD-10-CM | POA: Insufficient documentation

## 2019-05-23 DIAGNOSIS — Y9289 Other specified places as the place of occurrence of the external cause: Secondary | ICD-10-CM | POA: Insufficient documentation

## 2019-05-23 DIAGNOSIS — Z79899 Other long term (current) drug therapy: Secondary | ICD-10-CM | POA: Insufficient documentation

## 2019-05-23 DIAGNOSIS — Y999 Unspecified external cause status: Secondary | ICD-10-CM | POA: Diagnosis not present

## 2019-05-23 DIAGNOSIS — Z87891 Personal history of nicotine dependence: Secondary | ICD-10-CM | POA: Diagnosis not present

## 2019-05-23 DIAGNOSIS — X500XXA Overexertion from strenuous movement or load, initial encounter: Secondary | ICD-10-CM | POA: Insufficient documentation

## 2019-05-23 DIAGNOSIS — Y93B9 Activity, other involving muscle strengthening exercises: Secondary | ICD-10-CM | POA: Diagnosis not present

## 2019-05-23 MED ORDER — MELOXICAM 15 MG PO TABS: 15.0000 mg | ORAL_TABLET | Freq: Every day | ORAL | 1 refills | Status: AC

## 2019-05-23 NOTE — ED Provider Notes (Signed)
Emergency Department Provider Note  ____________________________________________  Time seen: Approximately 7:03 PM  I have reviewed the triage vital signs and the nursing notes.   HISTORY  Chief Complaint Ankle Pain   Historian Patient    HPI Amy Meza is a 34 y.o. female presents to the emergency department with lateral left ankle pain.  Patient reports that she was reaching down to stretch when she felt a pop and experienced immediate pain and swelling.  She reports that she does not have a history of left-sided ankle sprains.  No falls or traumas.  No numbness or tingling in the left foot.  No other alleviating measures have been attempted.   Past Medical History:  Diagnosis Date  . Anxiety    HAS BEEN OFF MEDS SINCE SEPT 2019  . Depression    HAS BEEN OFF MEDS SINCE SEPT 2019  . GERD (gastroesophageal reflux disease)    OCC-NO MEDS  . History of methicillin resistant staphylococcus aureus (MRSA)    AGE 56 OR 19     Immunizations up to date:  Yes.     Past Medical History:  Diagnosis Date  . Anxiety    HAS BEEN OFF MEDS SINCE SEPT 2019  . Depression    HAS BEEN OFF MEDS SINCE SEPT 2019  . GERD (gastroesophageal reflux disease)    OCC-NO MEDS  . History of methicillin resistant staphylococcus aureus (MRSA)    AGE 56 OR 19    There are no problems to display for this patient.   Past Surgical History:  Procedure Laterality Date  . CESAREAN SECTION     X2  . MASS EXCISION Right 06/20/2018   Procedure: EXCISION OF AXILLARY MASS;  Surgeon: Carolan Shiver, MD;  Location: [facility] ORS;  Service: General;  Laterality: Right;  . TUBAL LIGATION    . WISDOM TOOTH EXTRACTION      Prior to Admission medications   Medication Sig Start Date End Date Taking? Authorizing Provider  gabapentin (NEURONTIN) 300 MG capsule Take 1 capsule (300 mg total) by mouth 3 (three) times daily for 15 days. 06/20/18 07/05/18  Carolan Shiver, MD   HYDROcodone-acetaminophen (NORCO/VICODIN) 5-325 MG tablet Take 1 tablet by mouth every 6 (six) hours as needed for moderate pain. 06/20/18   Carolan Shiver, MD  ibuprofen (ADVIL,MOTRIN) 200 MG tablet Take 800 mg by mouth at bedtime as needed for moderate pain.    [provider]  LYSINE PO Take 1 tablet by mouth daily as needed (cold sores).    [provider]  meloxicam (MOBIC) 15 MG tablet Take 1 tablet (15 mg total) by mouth daily for 7 days. 05/23/19 05/30/19  Orvil Feil, PA-C  Multiple Vitamins-Minerals (AIRBORNE PO) Take 1 tablet by mouth as needed.    [provider]    Allergies Hydrocodone-acetaminophen, Latex, and Sulfa antibiotics  No family history on file.  Social History Social History   Tobacco Use  . Smoking status: Former Smoker    Packs/day: 1.00    Years: 19.00    Pack years: 19.00    Types: Cigarettes    Quit date: 06/18/2016    Years since quitting: 2.9  . Smokeless tobacco: Never Used  Substance Use Topics  . Alcohol use: Yes    Comment: RARE  . Drug use: Never     Review of Systems  Constitutional: No fever/chills Eyes:  No discharge ENT: No upper respiratory complaints. Respiratory: no cough. No SOB/ use of accessory muscles to breath Gastrointestinal:  No nausea, no vomiting.  No diarrhea.  No constipation. Musculoskeletal: Patient has left ankle pain.  Skin: Negative for rash, abrasions, lacerations, ecchymosis. ____________________________________________   PHYSICAL EXAM:  VITAL SIGNS: ED Triage Vitals  Enc Vitals Group     BP 05/23/19 1701 124/72     Pulse Rate 05/23/19 1701 97     Resp 05/23/19 1701 18     Temp 05/23/19 1701 98.2 F (36.8 C)     Temp Source 05/23/19 1701 Oral     SpO2 05/23/19 1701 98 %     Weight 05/23/19 1700 168 lb (76.2 kg)     Height 05/23/19 1700 5\' 5"  (1.651 m)     Head Circumference --      Peak Flow --      Pain Score 05/23/19 1707 5     Pain Loc --      Pain Edu?  --      Excl. in GC? --      Constitutional: Alert and oriented. Well appearing and in no acute distress. Eyes: Conjunctivae are normal. PERRL. EOMI. Head: Atraumatic. ENT: Respiratory: Normal respiratory effort without tachypnea or retractions. Lungs CTAB. Good air entry to the bases with no decreased or absent breath sounds Gastrointestinal: Bowel sounds x 4 quadrants. Soft and nontender to palpation. No guarding or rigidity. No distention. Musculoskeletal: Patient has tenderness to palpation over anterior talofibular ligament.  She performs limited active range of motion at the left ankle.  Palpable dorsalis pedis pulse bilaterally and symmetrically.  Capillary refill less than 2 seconds of all 5 left toes. Neurologic:  Normal for age. No gross focal neurologic deficits are appreciated.  Skin:  Skin is warm, dry and intact. No rash noted. Psychiatric: Mood and affect are normal for age. Speech and behavior are normal.   ____________________________________________   LABS (all labs ordered are listed, but only abnormal results are displayed)  Labs Reviewed - No data to display ____________________________________________  EKG   ____________________________________________  RADIOLOGY 05/25/19, personally viewed and evaluated these images (plain radiographs) as part of my medical decision making, as well as reviewing the written report by the radiologist.  DG Ankle Complete Left  Result Date: 05/23/2019 CLINICAL DATA:  Pain, stretching and felt pop with severe left ankle pain EXAM: LEFT ANKLE COMPLETE - 3+ VIEW COMPARISON:  None. FINDINGS: Moderate ankle joint effusion with mild circumferential swelling. No acute bony abnormality. Specifically, no fracture, subluxation, or dislocation. IMPRESSION: Moderate ankle joint effusion with mild circumferential swelling. No acute osseous abnormality. Electronically Signed   By: 05/25/2019 M.D.   On: 05/23/2019 18:21     ____________________________________________    PROCEDURES  Procedure(s) performed:     Procedures     Medications - No data to display   ____________________________________________   INITIAL IMPRESSION / ASSESSMENT AND PLAN / ED COURSE  Pertinent labs & imaging results that were available during my care of the patient were reviewed by me and considered in my medical decision making (see chart for details).      Assessment and plan Left ankle sprain 34 year old female presents to the emergency department with acute left ankle pain that occurred suddenly after patient was engaged in stretching.  X-ray examination reveals no bony abnormality.  On physical exam, patient had tenderness and edema over the lateral left malleolus.  Ace wrap was applied in the emergency department and crutches were provided.  Patient was discharged with meloxicam and advised to follow-up with podiatry.  Return  precautions were given.  All patient questions were answered.    ____________________________________________  FINAL CLINICAL IMPRESSION(S) / ED DIAGNOSES  Final diagnoses:  Sprain of anterior talofibular ligament of left ankle, initial encounter      NEW MEDICATIONS STARTED DURING THIS VISIT:  ED Discharge Orders         Ordered    meloxicam (MOBIC) 15 MG tablet  Daily     05/23/19 1851              This chart was dictated using voice recognition software/Dragon. Despite best efforts to proofread, errors can occur which can change the meaning. Any change was purely unintentional.     Karren Cobble 05/23/19 1906    Blake Divine, MD 05/27/19 2034

## 2019-05-23 NOTE — ED Triage Notes (Signed)
Patient presents to the ED with left ankle pain.  Patient states she was stretching today prior to running and heard and felt a pop and suddenly had severe pain to her left ankle.  Patient states a similar event occurred to her right ankle in May while she was mowing the lawn.  Patient states her father was recently diagnosed with degenerative bone disease and patient is concerned that she also possibly has this.

## 2019-11-10 ENCOUNTER — Other Ambulatory Visit: Payer: Self-pay

## 2019-11-10 ENCOUNTER — Emergency Department
Admission: EM | Admit: 2019-11-10 | Discharge: 2019-11-10 | Disposition: A | Discharge status: Home / Self Care | Payer: BLUE CROSS/BLUE SHIELD | Attending: Emergency Medicine | Admitting: Emergency Medicine

## 2019-11-10 DIAGNOSIS — T7840XA Allergy, unspecified, initial encounter: Secondary | ICD-10-CM | POA: Diagnosis present

## 2019-11-10 DIAGNOSIS — Z87891 Personal history of nicotine dependence: Secondary | ICD-10-CM | POA: Insufficient documentation

## 2019-11-10 DIAGNOSIS — Z91040 Latex allergy status: Secondary | ICD-10-CM | POA: Diagnosis not present

## 2019-11-10 MED ORDER — METHYLPREDNISOLONE SODIUM SUCC 125 MG IJ SOLR
125.0000 mg | Freq: Once | INTRAMUSCULAR | Status: AC
  Administered 2019-11-10: 125 mg via INTRAVENOUS

## 2019-11-10 MED ORDER — FAMOTIDINE IN NACL 20-0.9 MG/50ML-% IV SOLN
20.0000 mg | Freq: Once | INTRAVENOUS | Status: AC
  Administered 2019-11-10: 20 mg via INTRAVENOUS

## 2019-11-10 MED ORDER — EPINEPHRINE 0.3 MG/0.3ML IJ SOAJ
0.3000 mg | Freq: Once | INTRAMUSCULAR | Status: AC
  Administered 2019-11-10: 0.3 mg via INTRAMUSCULAR

## 2019-11-10 MED ORDER — PREDNISONE 20 MG PO TABS: 40.0000 mg | ORAL_TABLET | Freq: Every day | ORAL | 0 refills | Status: AC

## 2019-11-10 MED ORDER — SYMJEPI 0.3 MG/0.3ML IJ SOSY: 0.3000 mL | PREFILLED_SYRINGE | INTRAMUSCULAR | 0 refills | Status: DC

## 2019-11-10 NOTE — ED Triage Notes (Signed)
Pt comes POV with bug bite/sting about an hour ago. Pt has epi pen but is expired. Pt voice very hoarse, hives all over.

## 2019-11-10 NOTE — ED Notes (Signed)
Epi pen given by Jae Dire, RN in right thigh

## 2019-11-10 NOTE — ED Provider Notes (Signed)
[facility] Emergency Department Provider Note ____________________________________________   First MD Initiated Contact with Patient 11/10/19 1638     (approximate)  I have reviewed the triage vital signs and the nursing notes.   HISTORY  Chief Complaint Allergic Reaction    HPI Amy Meza is a 34 y.o. female with PMH as noted below who presents with an apparent allergic reaction, acute onset about an hour ago and occurring after she was stung or bitten by an unknown insect while outside.  The reaction is characterized by hoarseness of her voice, feeling like her throat is uncomfortable, and generalized itching and rash.  The patient took 100 mg of Benadryl and loratadine prior to arrival, but did not use an EpiPen because hers is expired.  She reports having a similar reaction a few years ago in similar circumstances of an unknown sting or bite.  Past Medical History:  Diagnosis Date  . Anxiety    HAS BEEN OFF MEDS SINCE SEPT 2019  . Depression    HAS BEEN OFF MEDS SINCE SEPT 2019  . GERD (gastroesophageal reflux disease)    OCC-NO MEDS  . History of methicillin resistant staphylococcus aureus (MRSA)    AGE 88 OR 19    There are no problems to display for this patient.   Past Surgical History:  Procedure Laterality Date  . CESAREAN SECTION     X2  . MASS EXCISION Right 06/20/2018   Procedure: EXCISION OF AXILLARY MASS;  Surgeon: Carolan Shiver, MD;  Location: [facility] ORS;  Service: General;  Laterality: Right;  . TUBAL LIGATION    . WISDOM TOOTH EXTRACTION      Prior to Admission medications   Medication Sig Start Date End Date Taking? Authorizing Provider  ibuprofen (ADVIL) 200 MG tablet Take 200 mg by mouth every 6 (six) hours as needed.   Yes [provider]  phentermine 37.5 MG capsule Take 37.5 mg by mouth every morning.   Yes [provider]  EPINEPHrine (SYMJEPI) 0.3 MG/0.3ML SOSY Inject 0.3 mLs (0.3 mg  total) as directed as directed. 11/10/19   Dionne Bucy, MD  predniSONE (DELTASONE) 20 MG tablet Take 2 tablets (40 mg total) by mouth daily for 3 days. 11/11/19 11/14/19  Dionne Bucy, MD    Allergies Hydrocodone-acetaminophen, Latex, and Sulfa antibiotics  History reviewed. No pertinent family history.  Social History Social History   Tobacco Use  . Smoking status: Former Smoker    Packs/day: 1.00    Years: 19.00    Pack years: 19.00    Types: Cigarettes    Quit date: 06/18/2016    Years since quitting: 3.3  . Smokeless tobacco: Never Used  Vaping Use  . Vaping Use: Former  . Quit date: 01/16/2018  Substance Use Topics  . Alcohol use: Yes    Comment: RARE  . Drug use: Never    Review of Systems  Constitutional: No fever/chills. Eyes: No redness. ENT: Positive for throat discomfort. Cardiovascular: Denies chest pain. Respiratory: Denies shortness of breath. Gastrointestinal: No vomiting or diarrhea.  Genitourinary: Negative for dysuria.  Musculoskeletal: Negative for back pain. Skin: Positive for hives. Neurological: Negative for headache. ____________________________________________   PHYSICAL EXAM:  VITAL SIGNS: ED Triage Vitals  Enc Vitals Group     BP 11/10/19 1637 122/82     Pulse Rate 11/10/19 1637 94     Resp 11/10/19 1641 (!) 29     Temp 11/10/19 1642 98.4 F (36.9 C)  Temp Source 11/10/19 1642 Oral     SpO2 11/10/19 1637 97 %     Weight 11/10/19 1642 150 lb (68 kg)     Height 11/10/19 1642 5\' 4"  (1.626 m)     Head Circumference --      Peak Flow --      Pain Score 11/10/19 1641 4     Pain Loc --      Pain Edu? --      Excl. in GC? --     Constitutional: Alert and oriented.  Uncomfortable and anxious appearing but in no acute distress. Eyes: Conjunctivae are normal.  Head: Atraumatic. Nose: No congestion/rhinnorhea. Mouth/Throat: Mucous membranes are moist.  Oropharynx clear with no swelling.  No pooled secretions or stridor.   Slightly hoarse voice. Neck: Normal range of motion.  Cardiovascular: Normal rate, regular rhythm. Grossly normal heart sounds.  Good peripheral circulation. Respiratory: Normal respiratory effort.  No retractions. Lungs CTAB. Gastrointestinal: No distention.  Musculoskeletal: Extremities warm and well perfused.  Neurologic:  Normal speech and language. No gross focal neurologic deficits are appreciated.  Skin:  Skin is warm and dry.  Scattered papules and urticaria over the extremities.  Approximately 4 cm urticarial lesion over the right ankle. Psychiatric: Mood and affect are normal. Speech and behavior are normal.  ____________________________________________   LABS (all labs ordered are listed, but only abnormal results are displayed)  Labs Reviewed - No data to display ____________________________________________  EKG   ____________________________________________  RADIOLOGY    ____________________________________________   PROCEDURES  Procedure(s) performed: No  Procedures  Critical Care performed: Yes  CRITICAL CARE Performed by: 01/11/20   Total critical care time: 20 minutes  Critical care time was exclusive of separately billable procedures and treating other patients.  Critical care was necessary to treat or prevent imminent or life-threatening deterioration.  Critical care was time spent personally by me on the following activities: development of treatment plan with patient and/or surrogate as well as nursing, discussions with consultants, evaluation of patient's response to treatment, examination of patient, obtaining history from patient or surrogate, ordering and performing treatments and interventions, ordering and review of laboratory studies, ordering and review of radiographic studies, pulse oximetry and re-evaluation of patient's condition. ____________________________________________   INITIAL IMPRESSION / ASSESSMENT AND PLAN / ED  COURSE  Pertinent labs & imaging results that were available during my care of the patient were reviewed by me and considered in my medical decision making (see chart for details).  34 year old female with PMH as noted above presents with an apparent allergic reaction after a sting or bite from an unknown insect.  She took Benadryl and loratadine at home with minimal improvement.  On exam, the patient is anxious and uncomfortable appearing.  Her vital signs are normal.  Oropharynx is clear with no visible swelling or pooled secretions.  The patient's voice is slightly hoarse but she is speaking without difficulty.  Exam is otherwise as described above.  Presentation is consistent with an allergic reaction.  We have given an EpiPen and will give IV prednisone and Pepcid in addition to the antihistamines that the patient already took.  We will then observe the patient for several hours.  ----------------------------------------- 6:57 PM on 11/10/2019 -----------------------------------------  The patient symptoms have completely resolved.  She has now been here for 2.5 hours and would like to go home.  She will be with her husband, so I feel that this is reasonable.  The patient understands that the epinephrine care  wear off and we usually like to watch patients for closer to 4 hours.  She agrees to return immediately for any new, worsening, or recurrent symptoms.  I prepped provided a prescription for new EpiPen as well as 3 additional days of prednisone.  ____________________________________________   FINAL CLINICAL IMPRESSION(S) / ED DIAGNOSES  Final diagnoses:  Allergic reaction, initial encounter      NEW MEDICATIONS STARTED DURING THIS VISIT:  New Prescriptions   EPINEPHRINE (SYMJEPI) 0.3 MG/0.3ML SOSY    Inject 0.3 mLs (0.3 mg total) as directed as directed.   PREDNISONE (DELTASONE) 20 MG TABLET    Take 2 tablets (40 mg total) by mouth daily for 3 days.     Note:  This  document was prepared using Dragon voice recognition software and may include unintentional dictation errors.    Dionne Bucy, MD 11/10/19 [phone]

## 2019-11-10 NOTE — ED Notes (Signed)
Pt ambulatory to toilet with steady gait noted.  

## 2019-11-10 NOTE — ED Notes (Signed)
First RN Note: pt presents to ED via POV with SO with c/o allergic reaction, pt's SO reports was stung by something, reports pt has epipens but did not take them due to being expired. Pt noted to be hoarse upon arrival to ED, pt also noted to be scratching at her entire body upon arrival to ED.

## 2019-11-10 NOTE — Discharge Instructions (Addendum)
Take the prednisone as prescribed for the next 3 days (starting the day after the ER visit).  Return to the ER for new, worsening, or persistent severe hives, difficulty breathing or hoarse voice, tightness in her throat, shortness of breath, or any other new or worsening symptoms that concern you.

## 2020-07-10 ENCOUNTER — Other Ambulatory Visit: Payer: Self-pay | Admitting: Family Medicine

## 2020-07-10 ENCOUNTER — Ambulatory Visit
Admission: RE | Admit: 2020-07-10 | Discharge: 2020-07-10 | Disposition: A | Discharge status: Home / Self Care | Payer: BC Managed Care – PPO | Source: Ambulatory Visit | Attending: Family Medicine | Admitting: Family Medicine

## 2020-07-10 ENCOUNTER — Other Ambulatory Visit (HOSPITAL_COMMUNITY): Payer: Self-pay | Admitting: Family Medicine

## 2020-07-10 ENCOUNTER — Other Ambulatory Visit: Payer: Self-pay

## 2020-07-10 DIAGNOSIS — G44319 Acute post-traumatic headache, not intractable: Secondary | ICD-10-CM
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
## 2022-09-06 ENCOUNTER — Ambulatory Visit: Payer: BC Managed Care – PPO | Attending: Certified Nurse Midwife | Admitting: Physical Therapy

## 2022-09-06 ENCOUNTER — Encounter: Payer: Self-pay | Admitting: Physical Therapy

## 2022-09-06 ENCOUNTER — Other Ambulatory Visit: Payer: Self-pay

## 2022-09-06 DIAGNOSIS — R279 Unspecified lack of coordination: Secondary | ICD-10-CM | POA: Diagnosis present

## 2022-09-06 DIAGNOSIS — M62838 Other muscle spasm: Secondary | ICD-10-CM | POA: Diagnosis present

## 2022-09-06 DIAGNOSIS — M6281 Muscle weakness (generalized): Secondary | ICD-10-CM
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
## 2022-10-11 ENCOUNTER — Ambulatory Visit: Payer: BC Managed Care – PPO | Admitting: Physical Therapy

## 2022-10-17 ENCOUNTER — Ambulatory Visit: Payer: BC Managed Care – PPO | Admitting: Physical Therapy

## 2022-10-24 ENCOUNTER — Encounter: Payer: BC Managed Care – PPO | Admitting: Physical Therapy

## 2022-10-31 ENCOUNTER — Encounter: Payer: BC Managed Care – PPO | Admitting: Physical Therapy

## 2022-11-07 ENCOUNTER — Encounter: Payer: BC Managed Care – PPO | Admitting: Physical Therapy

## 2023-04-04 ENCOUNTER — Other Ambulatory Visit: Payer: Self-pay | Admitting: Obstetrics and Gynecology
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
## 2023-04-20 ENCOUNTER — Encounter: Payer: Self-pay | Admitting: Urgent Care

## 2023-04-20 ENCOUNTER — Other Ambulatory Visit: Payer: Self-pay

## 2023-04-20 ENCOUNTER — Encounter
Admission: RE | Admit: 2023-04-20 | Discharge: 2023-04-20 | Disposition: A | Discharge status: Home / Self Care | Payer: BC Managed Care – PPO | Source: Ambulatory Visit | Attending: Obstetrics and Gynecology | Admitting: Obstetrics and Gynecology

## 2023-04-20 ENCOUNTER — Other Ambulatory Visit
Admission: RE | Admit: 2023-04-20 | Discharge: 2023-04-20 | Disposition: A | Discharge status: Home / Self Care | Payer: BC Managed Care – PPO | Source: Ambulatory Visit | Attending: Obstetrics and Gynecology | Admitting: Obstetrics and Gynecology

## 2023-04-20 VITALS — Ht 65.5 in | Wt 160.0 lb

## 2023-04-20 DIAGNOSIS — N939 Abnormal uterine and vaginal bleeding, unspecified: Secondary | ICD-10-CM | POA: Insufficient documentation

## 2023-04-20 DIAGNOSIS — Z01818 Encounter for other preprocedural examination: Secondary | ICD-10-CM

## 2023-04-20 HISTORY — DX: Unspecified asthma, uncomplicated: J45.909

## 2023-04-20 HISTORY — DX: Anemia, unspecified: D64.9

## 2023-04-20 LAB — TYPE AND SCREEN
ABO/RH(D): O POS
Antibody Screen: NEGATIVE
Extend sample reason: UNDETERMINED

## 2023-04-20 LAB — CBC
HCT: 38.9 % (ref 36.0–46.0)
Hemoglobin: 13.1 g/dL (ref 12.0–15.0)
MCH: 30.6 pg (ref 26.0–34.0)
MCHC: 33.7 g/dL (ref 30.0–36.0)
MCV: 90.9 fL (ref 80.0–100.0)
Platelets: 240 10*3/uL (ref 150–400)
RBC: 4.28 MIL/uL (ref 3.87–5.11)
RDW: 13.1 % (ref 11.5–15.5)
WBC: 7.4 10*3/uL (ref 4.0–10.5)
nRBC: 0 % (ref 0.0–0.2)

## 2023-04-20 NOTE — Patient Instructions (Addendum)
Your procedure is scheduled on: Thursday 04/27/23 To find out your arrival time, please call [phone] between 1PM - 3PM on:  Wednesday 04/26/23  Report to the Registration Desk on the 1st floor of the [facility]. FREE Valet parking is available.  If your arrival time is 6:00 am, do not arrive before that time as the [facility] entrance doors do not open until 6:00 am.  REMEMBER: Instructions that are not followed completely may result in serious medical risk, up to and including death; or upon the discretion of your surgeon and anesthesiologist your surgery may need to be rescheduled.  Do not eat food after midnight the night before surgery.  No gum chewing or hard candies.  You may however, drink CLEAR liquids up to 2 hours before you are scheduled to arrive for your surgery. Do not drink anything within 2 hours of your scheduled arrival time.  Clear liquids include: - water  - apple juice without pulp - gatorade (not RED colors) - black coffee or tea (Do NOT add milk or creamers to the coffee or tea) Do NOT drink anything that is not on this list.  Type 1 and Type 2 diabetics should only drink water.  One week prior to surgery: Stop Anti-inflammatories (NSAIDS) such as Advil, Aleve, Ibuprofen, Motrin, Naproxen, Naprosyn and Aspirin based products such as Excedrin, Goody's Powder, BC Powder. You may however, continue to take Tylenol if needed for pain up until the day of surgery.  Stop ANY OVER THE COUNTER supplements and vitamins for 7 days until after surgery. ( You can continue your iron)  Continue taking all prescribed medications.   TAKE ONLY THESE MEDICATIONS THE MORNING OF SURGERY WITH A SIP OF WATER:  cetirizine (ZYRTEC) 10 MG tablet   Use inhalers on the day of surgery.   No Alcohol for 24 hours before or after surgery.  No Smoking including e-cigarettes for 24 hours before surgery.  No chewable tobacco products for at least 6 hours before surgery.  No  nicotine patches on the day of surgery.  Do not use any "recreational" drugs for at least a week (preferably 2 weeks) before your surgery.  Please be advised that the combination of cocaine and anesthesia may have negative outcomes, up to and including death. If you test positive for cocaine, your surgery will be cancelled.  On the morning of surgery brush your teeth with toothpaste and water, you may rinse your mouth with mouthwash if you wish. Do not swallow any toothpaste or mouthwash.  Use CHG Soap or wipes as directed on instruction sheet.  Do not wear lotions, powders, or perfumes.   Do not shave body hair from the neck down 48 hours before surgery.  Wear clean comfortable clothing (specific to your surgery type) to the hospital.  Do not wear jewelry, make-up, hairpins, clips or nail polish.  For welded (permanent) jewelry: bracelets, anklets, waist bands, etc.  Please have this removed prior to surgery.  If it is not removed, there is a chance that hospital personnel will need to cut it off on the day of surgery. Contact lenses, hearing aids and dentures may not be worn into surgery.  Do not bring valuables to the hospital. [facility] is not responsible for any missing/lost belongings or valuables.   Notify your doctor if there is any change in your medical condition (cold, fever, infection).  If you are being discharged the day of surgery, you will not be allowed to drive home. You  will need a responsible individual to drive you home and stay with you for 24 hours after surgery.   If you are taking public transportation, you will need to have a responsible individual with you.  If you are being admitted to the hospital overnight, leave your suitcase in the car. After surgery it may be brought to your room.  In case of increased patient census, it may be necessary for you, the patient, to continue your postoperative care in the Same Day Surgery department.  After surgery,  you can help prevent lung complications by doing breathing exercises.  Take deep breaths and cough every 1-2 hours. Your doctor may order a device called an Incentive Spirometer to help you take deep breaths. When coughing or sneezing, hold a pillow firmly against your incision with both hands. This is called "splinting." Doing this helps protect your incision. It also decreases belly discomfort.  Surgery Visitation Policy:  Patients undergoing a surgery or procedure may have two family members or support persons with them as long as the person is not COVID-19 positive or experiencing its symptoms.   Inpatient Visitation:    Visiting hours are 7 a.m. to 8 p.m. Up to four visitors are allowed at one time in a patient room. The visitors may rotate out with other people during the day. One designated support person (adult) may remain overnight.  Please call the Pre-admissions Testing Dept. at [phone] if you have any questions about these instructions.     Preparing for Surgery with CHLORHEXIDINE GLUCONATE (CHG) Soap  Chlorhexidine Gluconate (CHG) Soap  o An antiseptic cleaner that kills germs and bonds with the skin to continue killing germs even after washing  o Used for showering the night before surgery and morning of surgery  Before surgery, you can play an important role by reducing the number of germs on your skin.  CHG (Chlorhexidine gluconate) soap is an antiseptic cleanser which kills germs and bonds with the skin to continue killing germs even after washing.  Please do not use if you have an allergy to CHG or antibacterial soaps. If your skin becomes reddened/irritated stop using the CHG.  1. Shower the NIGHT BEFORE SURGERY and the MORNING OF SURGERY with CHG soap.  2. If you choose to wash your hair, wash your hair first as usual with your normal shampoo.  3. After shampooing, rinse your hair and body thoroughly to remove the shampoo.  4. Use CHG as you would any  other liquid soap. You can apply CHG directly to the skin and wash gently with a scrungie or a clean washcloth.  5. Apply the CHG soap to your body only from the neck down. Do not use on open wounds or open sores. Avoid contact with your eyes, ears, mouth, and genitals (private parts). Wash face and genitals (private parts) with your normal soap.  6. Wash thoroughly, paying special attention to the area where your surgery will be performed.  7. Thoroughly rinse your body with warm water.  8. Do not shower/wash with your normal soap after using and rinsing off the CHG soap.  9. Pat yourself dry with a clean towel.  10. Wear clean pajamas to bed the night before surgery.  12. Place clean sheets on your bed the night of your first shower and do not sleep with pets.  13. Shower again with the CHG soap on the day of surgery prior to arriving at the hospital.  14. Do not apply any deodorants/lotions/powders.  15. Please wear clean clothes to the hospital.

## 2023-04-26 MED ORDER — POVIDONE-IODINE 10 % EX SWAB
2.0000 | Freq: Once | CUTANEOUS | Status: AC
Start: 2023-04-26 — End: 2023-04-27
  Administered 2023-04-27: 2 via TOPICAL

## 2023-04-26 MED ORDER — CHLORHEXIDINE GLUCONATE 0.12 % MT SOLN
15.0000 mL | Freq: Once | OROMUCOSAL | Status: AC
Start: 2023-04-26 — End: 2023-04-27
  Administered 2023-04-27: 15 mL via OROMUCOSAL

## 2023-04-26 MED ORDER — ORAL CARE MOUTH RINSE
15.0000 mL | Freq: Once | OROMUCOSAL | Status: AC
Start: 2023-04-26 — End: 2023-04-27

## 2023-04-26 MED ORDER — CEFAZOLIN SODIUM-DEXTROSE 2-4 GM/100ML-% IV SOLN
2.0000 g | INTRAVENOUS | Status: AC
  Administered 2023-04-27: 2 g via INTRAVENOUS

## 2023-04-26 MED ORDER — GABAPENTIN 300 MG PO CAPS
300.0000 mg | ORAL_CAPSULE | ORAL | Status: AC
  Administered 2023-04-27: 300 mg via ORAL

## 2023-04-26 MED ORDER — ACETAMINOPHEN 500 MG PO TABS
1000.0000 mg | ORAL_TABLET | ORAL | Status: AC
  Administered 2023-04-27: 1000 mg via ORAL

## 2023-04-27 ENCOUNTER — Other Ambulatory Visit: Payer: Self-pay

## 2023-04-27 ENCOUNTER — Ambulatory Visit
Admission: RE | Admit: 2023-04-27 | Discharge: 2023-04-27 | Disposition: A | Discharge status: Home / Self Care | Payer: BC Managed Care – PPO | Attending: Obstetrics and Gynecology | Admitting: Obstetrics and Gynecology

## 2023-04-27 ENCOUNTER — Ambulatory Visit: Payer: BC Managed Care – PPO | Admitting: Urgent Care

## 2023-04-27 ENCOUNTER — Encounter
Admission: RE | Disposition: A | Discharge status: Home / Self Care | Payer: Self-pay | Source: Home / Self Care | Attending: Obstetrics and Gynecology

## 2023-04-27 ENCOUNTER — Ambulatory Visit: Payer: BC Managed Care – PPO | Admitting: Certified Registered Nurse Anesthetist

## 2023-04-27 ENCOUNTER — Encounter: Payer: Self-pay | Admitting: Obstetrics and Gynecology

## 2023-04-27 DIAGNOSIS — Z9851 Tubal ligation status: Secondary | ICD-10-CM | POA: Insufficient documentation

## 2023-04-27 DIAGNOSIS — N736 Female pelvic peritoneal adhesions (postinfective): Secondary | ICD-10-CM

## 2023-04-27 DIAGNOSIS — Z87891 Personal history of nicotine dependence: Secondary | ICD-10-CM | POA: Diagnosis not present

## 2023-04-27 DIAGNOSIS — N888 Other specified noninflammatory disorders of cervix uteri: Secondary | ICD-10-CM | POA: Diagnosis not present

## 2023-04-27 DIAGNOSIS — N939 Abnormal uterine and vaginal bleeding, unspecified: Secondary | ICD-10-CM

## 2023-04-27 DIAGNOSIS — N920 Excessive and frequent menstruation with regular cycle: Secondary | ICD-10-CM | POA: Diagnosis present

## 2023-04-27 DIAGNOSIS — Z79899 Other long term (current) drug therapy: Secondary | ICD-10-CM | POA: Insufficient documentation

## 2023-04-27 DIAGNOSIS — N838 Other noninflammatory disorders of ovary, fallopian tube and broad ligament: Secondary | ICD-10-CM | POA: Insufficient documentation

## 2023-04-27 DIAGNOSIS — Z01818 Encounter for other preprocedural examination: Secondary | ICD-10-CM

## 2023-04-27 DIAGNOSIS — K219 Gastro-esophageal reflux disease without esophagitis: Secondary | ICD-10-CM | POA: Diagnosis not present

## 2023-04-27 HISTORY — PX: ROBOTIC ASSISTED LAPAROSCOPIC HYSTERECTOMY AND SALPINGECTOMY: SHX6379

## 2023-04-27 LAB — TYPE AND SCREEN
ABO/RH(D): O POS
Antibody Screen: NEGATIVE

## 2023-04-27 LAB — POCT PREGNANCY, URINE: Preg Test, Ur: NEGATIVE

## 2023-04-27 SURGERY — XI ROBOTIC ASSISTED LAPAROSCOPIC HYSTERECTOMY AND SALPINGECTOMY
Anesthesia: General | Site: Uterus | Laterality: Bilateral

## 2023-04-27 MED ORDER — FENTANYL CITRATE (PF) 100 MCG/2ML IJ SOLN
INTRAMUSCULAR | Status: AC
  Filled 2023-04-27: qty 2

## 2023-04-27 MED ORDER — LIDOCAINE HCL (PF) 2 % IJ SOLN
INTRAMUSCULAR | Status: AC
  Filled 2023-04-27: qty 5

## 2023-04-27 MED ORDER — DEXAMETHASONE SODIUM PHOSPHATE 10 MG/ML IJ SOLN
INTRAMUSCULAR | Status: AC
  Filled 2023-04-27: qty 1

## 2023-04-27 MED ORDER — FENTANYL CITRATE (PF) 100 MCG/2ML IJ SOLN
INTRAMUSCULAR | Status: DC | PRN
  Administered 2023-04-27 (×4): 50 ug via INTRAVENOUS

## 2023-04-27 MED ORDER — METHYLENE BLUE (ANTIDOTE) 1 % IV SOLN
INTRAVENOUS | Status: AC
  Filled 2023-04-27: qty 10

## 2023-04-27 MED ORDER — BUPIVACAINE HCL (PF) 0.5 % IJ SOLN
INTRAMUSCULAR | Status: AC
  Filled 2023-04-27: qty 30

## 2023-04-27 MED ORDER — DEXAMETHASONE SODIUM PHOSPHATE 10 MG/ML IJ SOLN
INTRAMUSCULAR | Status: DC | PRN
  Administered 2023-04-27: 10 mg via INTRAVENOUS

## 2023-04-27 MED ORDER — ROCURONIUM BROMIDE 100 MG/10ML IV SOLN
INTRAVENOUS | Status: DC | PRN
  Administered 2023-04-27 (×2): 20 mg via INTRAVENOUS
  Administered 2023-04-27: 40 mg via INTRAVENOUS
  Administered 2023-04-27: 10 mg via INTRAVENOUS

## 2023-04-27 MED ORDER — PROPOFOL 10 MG/ML IV BOLUS
INTRAVENOUS | Status: AC
  Filled 2023-04-27: qty 20

## 2023-04-27 MED ORDER — OXYCODONE HCL 5 MG/5ML PO SOLN: 5.0000 mg | Freq: Once | ORAL | Status: AC | PRN

## 2023-04-27 MED ORDER — HYDROMORPHONE HCL 1 MG/ML IJ SOLN
INTRAMUSCULAR | Status: AC
  Filled 2023-04-27: qty 1

## 2023-04-27 MED ORDER — BUPIVACAINE HCL (PF) 0.5 % IJ SOLN
INTRAMUSCULAR | Status: DC | PRN
  Administered 2023-04-27: 7 mL

## 2023-04-27 MED ORDER — CHLORHEXIDINE GLUCONATE 0.12 % MT SOLN
OROMUCOSAL | Status: AC
  Filled 2023-04-27: qty 15

## 2023-04-27 MED ORDER — HYDROMORPHONE HCL 1 MG/ML IJ SOLN
INTRAMUSCULAR | Status: DC | PRN
  Administered 2023-04-27 (×2): .5 mg via INTRAVENOUS

## 2023-04-27 MED ORDER — 0.9 % SODIUM CHLORIDE (POUR BTL) OPTIME
TOPICAL | Status: DC | PRN
  Administered 2023-04-27: 500 mL

## 2023-04-27 MED ORDER — GABAPENTIN 800 MG PO TABS: 800.0000 mg | ORAL_TABLET | Freq: Every day | ORAL | 0 refills | Status: AC

## 2023-04-27 MED ORDER — ONDANSETRON HCL 4 MG/2ML IJ SOLN
INTRAMUSCULAR | Status: AC
  Filled 2023-04-27: qty 2

## 2023-04-27 MED ORDER — GABAPENTIN 300 MG PO CAPS
ORAL_CAPSULE | ORAL | Status: AC
  Filled 2023-04-27: qty 1

## 2023-04-27 MED ORDER — LIDOCAINE HCL (CARDIAC) PF 100 MG/5ML IV SOSY
PREFILLED_SYRINGE | INTRAVENOUS | Status: DC | PRN
  Administered 2023-04-27: 80 mg via INTRAVENOUS

## 2023-04-27 MED ORDER — GLYCOPYRROLATE 0.2 MG/ML IJ SOLN
INTRAMUSCULAR | Status: DC | PRN
  Administered 2023-04-27 (×2): .2 mg via INTRAVENOUS

## 2023-04-27 MED ORDER — OXYCODONE HCL 5 MG PO TABS
ORAL_TABLET | ORAL | Status: AC
  Filled 2023-04-27: qty 1

## 2023-04-27 MED ORDER — EPHEDRINE 5 MG/ML INJ
INTRAVENOUS | Status: AC
  Filled 2023-04-27: qty 5

## 2023-04-27 MED ORDER — ACETAMINOPHEN EXTRA STRENGTH 500 MG PO TABS: 1000.0000 mg | ORAL_TABLET | Freq: Four times a day (QID) | ORAL | 0 refills | Status: AC

## 2023-04-27 MED ORDER — LACTATED RINGERS IV SOLN: INTRAVENOUS | Status: DC

## 2023-04-27 MED ORDER — IBUPROFEN 800 MG PO TABS: 800.0000 mg | ORAL_TABLET | Freq: Three times a day (TID) | ORAL | 1 refills | Status: AC

## 2023-04-27 MED ORDER — CEFAZOLIN SODIUM-DEXTROSE 2-4 GM/100ML-% IV SOLN
INTRAVENOUS | Status: AC
  Filled 2023-04-27: qty 100

## 2023-04-27 MED ORDER — MIDAZOLAM HCL 2 MG/2ML IJ SOLN
INTRAMUSCULAR | Status: AC
  Filled 2023-04-27: qty 2

## 2023-04-27 MED ORDER — MIDAZOLAM HCL 2 MG/2ML IJ SOLN
INTRAMUSCULAR | Status: DC | PRN
  Administered 2023-04-27: 2 mg via INTRAVENOUS

## 2023-04-27 MED ORDER — DOCUSATE SODIUM 100 MG PO CAPS: 100.0000 mg | ORAL_CAPSULE | Freq: Two times a day (BID) | ORAL | 0 refills | Status: AC

## 2023-04-27 MED ORDER — HYDROMORPHONE HCL 1 MG/ML IJ SOLN
0.2500 mg | INTRAMUSCULAR | Status: DC | PRN
  Administered 2023-04-27: 0.5 mg via INTRAVENOUS
  Administered 2023-04-27 (×2): 0.25 mg via INTRAVENOUS

## 2023-04-27 MED ORDER — OXYCODONE HCL 5 MG PO TABS
5.0000 mg | ORAL_TABLET | Freq: Once | ORAL | Status: AC | PRN
  Administered 2023-04-27: 5 mg via ORAL

## 2023-04-27 MED ORDER — PROPOFOL 10 MG/ML IV BOLUS
INTRAVENOUS | Status: DC | PRN
  Administered 2023-04-27: 150 mg via INTRAVENOUS

## 2023-04-27 MED ORDER — EPHEDRINE SULFATE-NACL 50-0.9 MG/10ML-% IV SOSY
PREFILLED_SYRINGE | INTRAVENOUS | Status: DC | PRN
  Administered 2023-04-27: 5 mg via INTRAVENOUS

## 2023-04-27 MED ORDER — ACETAMINOPHEN 500 MG PO TABS
ORAL_TABLET | ORAL | Status: AC
  Filled 2023-04-27: qty 2

## 2023-04-27 MED ORDER — ONDANSETRON HCL 4 MG/2ML IJ SOLN
INTRAMUSCULAR | Status: DC | PRN
  Administered 2023-04-27: 4 mg via INTRAVENOUS

## 2023-04-27 MED ORDER — ROCURONIUM BROMIDE 10 MG/ML (PF) SYRINGE
PREFILLED_SYRINGE | INTRAVENOUS | Status: AC
  Filled 2023-04-27: qty 10

## 2023-04-27 MED ORDER — OXYCODONE HCL 5 MG PO TABS: 5.0000 mg | ORAL_TABLET | ORAL | 0 refills | Status: AC | PRN

## 2023-04-27 MED ORDER — GLYCOPYRROLATE 0.2 MG/ML IJ SOLN
INTRAMUSCULAR | Status: AC
  Filled 2023-04-27: qty 1

## 2023-04-27 SURGICAL SUPPLY — 60 items
APPLICATOR ARISTA FLEXITIP XL (MISCELLANEOUS) IMPLANT
BAG URINE DRAIN 2000ML AR STRL (UROLOGICAL SUPPLIES) ×2 IMPLANT
BLADE SURG SZ11 CARB STEEL (BLADE) ×2 IMPLANT
CANNULA CAP OBTURATR AIRSEAL 8 (CAP) ×2 IMPLANT
CATH ROBINSON RED A/P 16FR (CATHETERS) ×1 IMPLANT
CATH URTH 16FR FL 2W BLN LF (CATHETERS) ×2 IMPLANT
COUNTER NEEDLE 20/40 LG (NEEDLE) ×2 IMPLANT
COVER TIP SHEARS 8 DVNC (MISCELLANEOUS) ×2 IMPLANT
DERMABOND ADVANCED .7 DNX12 (GAUZE/BANDAGES/DRESSINGS) ×2 IMPLANT
DRAPE ARM DVNC X/XI (DISPOSABLE) ×8 IMPLANT
DRAPE COLUMN DVNC XI (DISPOSABLE) ×2 IMPLANT
DRAPE SHEET LG 3/4 BI-LAMINATE (DRAPES) ×2 IMPLANT
DRIVER NDL MEGA 8 DVNC XI (INSTRUMENTS) ×1 IMPLANT
DRIVER NDLE MEGA DVNC XI (INSTRUMENTS) ×2
ELECT REM PT RETURN 9FT ADLT (ELECTROSURGICAL) ×2
ELECTRODE REM PT RTRN 9FT ADLT (ELECTROSURGICAL) ×1 IMPLANT
FORCEPS BPLR FENES DVNC XI (FORCEP) ×2 IMPLANT
GAUZE 4X4 16PLY ~~LOC~~+RFID DBL (SPONGE) ×2 IMPLANT
GLOVE BIO SURGEON STRL SZ7 (GLOVE) ×8 IMPLANT
GLOVE BIOGEL PI IND STRL 7.5 (GLOVE) ×8 IMPLANT
GLOVE INDICATOR 7.5 STRL GRN (GLOVE) ×2 IMPLANT
GOWN STRL REUS W/ TWL LRG LVL3 (GOWN DISPOSABLE) ×12 IMPLANT
HEMOSTAT ARISTA ABSORB 3G PWDR (HEMOSTASIS) IMPLANT
IRRIGATION STRYKERFLOW (MISCELLANEOUS) IMPLANT
IRRIGATOR STRYKERFLOW (MISCELLANEOUS)
IV LACTATED RINGERS 1000ML (IV SOLUTION) ×1 IMPLANT
IV NS 1000ML BAXH (IV SOLUTION) IMPLANT
KIT PINK PAD W/HEAD ARE REST (MISCELLANEOUS) ×2
KIT PINK PAD W/HEAD ARM REST (MISCELLANEOUS) ×1 IMPLANT
LABEL OR SOLS (LABEL) ×2 IMPLANT
MANIFOLD NEPTUNE II (INSTRUMENTS) ×2 IMPLANT
MANIPULATOR VCARE LG CRV RETR (MISCELLANEOUS) IMPLANT
MANIPULATOR VCARE SML CRV RETR (MISCELLANEOUS) IMPLANT
MANIPULATOR VCARE STD CRV RETR (MISCELLANEOUS) ×1 IMPLANT
NS IRRIG 1000ML POUR BTL (IV SOLUTION) ×2 IMPLANT
NS IRRIG 500ML POUR BTL (IV SOLUTION) ×2 IMPLANT
OBTURATOR OPTICAL STND 8 DVNC (TROCAR) ×2
OBTURATOR OPTICALSTD 8 DVNC (TROCAR) ×1 IMPLANT
OCCLUDER COLPOPNEUMO (BALLOONS) ×2 IMPLANT
PACK GYN LAPAROSCOPIC (MISCELLANEOUS) ×2 IMPLANT
PAD OB MATERNITY 4.3X12.25 (PERSONAL CARE ITEMS) ×2 IMPLANT
PAD PREP OB/GYN DISP 24X41 (PERSONAL CARE ITEMS) ×2 IMPLANT
SCISSORS MNPLR CVD DVNC XI (INSTRUMENTS) ×2 IMPLANT
SCRUB CHG 4% DYNA-HEX 4OZ (MISCELLANEOUS) ×2 IMPLANT
SEAL UNIV 5-12 XI (MISCELLANEOUS) ×6 IMPLANT
SEALER VESSEL EXT DVNC XI (MISCELLANEOUS) ×1 IMPLANT
SET CYSTO W/LG BORE CLAMP LF (SET/KITS/TRAYS/PACK) ×1 IMPLANT
SET TUBE FILTERED XL AIRSEAL (SET/KITS/TRAYS/PACK) ×2 IMPLANT
SOL ELECTROSURG ANTI STICK (MISCELLANEOUS) ×2
SOL PREP PVP 2OZ (MISCELLANEOUS) ×2
SOLUTION ELECTROSURG ANTI STCK (MISCELLANEOUS) ×1 IMPLANT
SOLUTION PREP PVP 2OZ (MISCELLANEOUS) ×1 IMPLANT
SURGILUBE 2OZ TUBE FLIPTOP (MISCELLANEOUS) ×2 IMPLANT
SUT MNCRL 4-0 27XMFL (SUTURE) ×2
SUT STRATA 2-0 30 CT-2 (SUTURE) ×2 IMPLANT
SUT VIC AB 0 CT2 27 (SUTURE) ×3 IMPLANT
SUTURE MNCRL 4-0 27XMF (SUTURE) ×1 IMPLANT
TRAP FLUID SMOKE EVACUATOR (MISCELLANEOUS) ×2 IMPLANT
WATER STERILE IRR 1000ML POUR (IV SOLUTION) ×1 IMPLANT
WATER STERILE IRR 500ML POUR (IV SOLUTION) ×2 IMPLANT

## 2023-04-27 NOTE — Transfer of Care (Signed)
Immediate Anesthesia Transfer of Care Note  Patient: Amy Meza  Procedure(s) Performed: XI ROBOTIC ASSISTED LAPAROSCOPIC HYSTERECTOMY AND SALPINGECTOMY (Bilateral: Uterus)  Patient Location: PACU  Anesthesia Type:General  Level of Consciousness: awake, alert , and oriented  Airway & Oxygen Therapy: Patient Spontanous Breathing and Patient connected to face mask oxygen  Post-op Assessment: Report given to RN and Post -op Vital signs reviewed and stable  Post vital signs: Reviewed and stable  Last Vitals:  Vitals Value Taken Time  BP 104/70 04/27/23 1415  Temp 36.7 C 04/27/23 1413  Pulse 85 04/27/23 1416  Resp 19 04/27/23 1418  SpO2 100 % 04/27/23 1416  Vitals shown include unfiled device data.  Last Pain:  Vitals:   04/27/23 1052  TempSrc: Oral  PainSc: 0-No pain         Complications: No notable events documented.

## 2023-04-27 NOTE — Discharge Instructions (Signed)
Discharge instructions after  robotically-assisted total laparoscopic hysterectomy   For the next three days, take ibuprofen and acetaminophen on a schedule, every 8 hours. You can take them together or you can intersperse them, and take one every four hours. I also gave you gabapentin for nighttime, to help you sleep and also to control pain. Take gabapentin medicines at night for at least the next 3 nights. You also have a narcotic, oxycodone, to take as needed if the above medicines don't help.  Postop constipation is a major cause of pain. Stay well hydrated, walk as you tolerate, and take over the counter senna as well as stool softeners if you need them.   Signs and Symptoms to Report Call our office at [phone] if you have any of the following.   Fever over 100.4 degrees or higher  Severe stomach pain not relieved with pain medications  Bright red bleeding that's heavier than a period that does not slow with rest  To go the bathroom a lot (frequency), you can't hold your urine (urgency), or it hurts when you empty your bladder (urinate)  Chest pain  Shortness of breath  Pain in the calves of your legs  Severe nausea and vomiting not relieved with anti-nausea medications  Signs of infection around your wounds, such as redness, hot to touch, swelling, green/yellow drainage (like pus), bad smelling discharge  Any concerns  What You Can Expect after Surgery  You may see some pink tinged, bloody fluid and bruising around the wound. This is normal.  You may notice shoulder and neck pain. This is caused by the gas used during surgery to expand your abdomen so your surgeon could get to the uterus easier.  You may have a sore throat because of the tube in your mouth during general anesthesia. This will go away in 2 to 3 days.  You may have some stomach cramps.  You may notice spotting on your panties.  You may have pain around the incision sites.   Activities after Your  Discharge Follow these guidelines to help speed your recovery at home:  Do the coughing and deep breathing as you did in the hospital for 2 weeks. Use the small blue breathing device, called the incentive spirometer for 2 weeks.  Don't drive if you are in pain or taking narcotic pain medicine. You may drive when you can safely slam on the brakes, turn the wheel forcefully, and rotate your torso comfortably. This is typically 1-2 weeks. Practice in a parking lot or side street prior to attempting to drive regularly.   Ask others to help with household chores for 4 weeks.  Do not lift anything heavier that 10 pounds for 4-6 weeks. This includes pets, children, and groceries.  Don't do strenuous activities, exercises, or sports like vacuuming, tennis, squash, etc. until your doctor says it is safe to do so. ---Maintain pelvic rest for 12 weeks. This means nothing in the vagina or rectum at all (no douching, tampons, intercourse) for 12 weeks.   Walk as you feel able. Rest often since it may take two or three weeks for your energy level to return to normal.   You may climb stairs  Avoid constipation:   -Eat fruits, vegetables, and whole grains. Eat small meals as your appetite will take time to return to normal.   -Drink 6 to 8 glasses of water each day unless your doctor has told you to limit your fluids.   -Use a laxative or  stool softener as needed if constipation becomes a problem. You may take Miralax, metamucil, Citrucil, Colace, Senekot, FiberCon, etc. If this does not relieve the constipation, try two tablespoons of Milk Of Magnesia every 8 hours until your bowels move.   You may shower. Gently wash the wounds with a mild soap and water. Pat dry.  Do not get in a hot tub, swimming pool, etc. for 6 weeks.  Do not use lotions, oils, powders on the wounds.  Do not douche, use tampons, or have sex until your doctor says it is okay.  Take your pain medicine when you need it. The medicine may not  work as well if the pain is bad.  Take the medicines you were taking before surgery. Other medications you will need are pain medications (Norco or Percocet) and nausea medications (Zofran).

## 2023-04-27 NOTE — Anesthesia Preprocedure Evaluation (Signed)
Anesthesia Evaluation  Patient identified by MRN, date of birth, ID band Patient awake    Reviewed: Allergy & Precautions, NPO status , Patient's Chart, lab work & pertinent test results  History of Anesthesia Complications Negative for: history of anesthetic complications  Airway Mallampati: I  TM Distance: >3 FB Neck ROM: full    Dental no notable dental hx.    Pulmonary asthma , former smoker   Pulmonary exam normal        Cardiovascular negative cardio ROS Normal cardiovascular exam     Neuro/Psych  PSYCHIATRIC DISORDERS Anxiety Depression    negative neurological ROS     GI/Hepatic Neg liver ROS,GERD  Controlled,,  Endo/Other  negative endocrine ROS    Renal/GU negative Renal ROS  negative genitourinary   Musculoskeletal   Abdominal   Peds  Hematology  (+) Blood dyscrasia, anemia   Anesthesia Other Findings Past Medical History: No date: Anemia     Comment:  B12 deficiency No date: Anxiety     Comment:  HAS BEEN OFF MEDS SINCE SEPT 2019 No date: Asthma No date: Depression     Comment:  HAS BEEN OFF MEDS SINCE SEPT 2019 No date: GERD (gastroesophageal reflux disease)     Comment:  OCC-NO MEDS No date: History of methicillin resistant staphylococcus aureus (MRSA)     Comment:  AGE 17 OR 19 left arm  Past Surgical History: No date: CESAREAN SECTION     Comment:  X2 06/20/2018: MASS EXCISION; Right     Comment:  Procedure: EXCISION OF AXILLARY MASS;  Surgeon:               Carolan Shiver, MD;  Location: [facility] ORS;  Service:              General;  Laterality: Right; No date: TUBAL LIGATION No date: WISDOM TOOTH EXTRACTION  BMI    Body Mass Index: 26.22 kg/m      Reproductive/Obstetrics negative OB ROS                             Anesthesia Physical Anesthesia Plan  ASA: 3  Anesthesia Plan: General LMA   Post-op Pain Management: Tylenol PO (pre-op)*,  Gabapentin PO (pre-op)*, Toradol IV (intra-op)*, Ketamine IV* and Precedex   Induction: Intravenous  PONV Risk Score and Plan: 3 and Dexamethasone, Ondansetron, Midazolam and Treatment may vary due to age or medical condition  Airway Management Planned: Oral ETT  Additional Equipment:   Intra-op Plan:   Post-operative Plan: Extubation in OR  Informed Consent: I have reviewed the patients History and Physical, chart, labs and discussed the procedure including the risks, benefits and alternatives for the proposed anesthesia with the patient or authorized representative who has indicated his/her understanding and acceptance.     Dental Advisory Given  Plan Discussed with: Anesthesiologist, CRNA and Surgeon  Anesthesia Plan Comments: (Patient consented for risks of anesthesia including but not limited to:  - adverse reactions to medications - damage to eyes, teeth, lips or other oral mucosa - nerve damage due to positioning  - sore throat or hoarseness - Damage to heart, brain, nerves, lungs, other parts of body or loss of life  Patient voiced understanding and assent.)       Anesthesia Quick Evaluation

## 2023-04-27 NOTE — Op Note (Signed)
Amy Meza PROCEDURE DATE: 04/27/2023  PREOPERATIVE DIAGNOSIS: Menorrhagia POSTOPERATIVE DIAGNOSIS: The same, pelvic adhesive disease PROCEDURE:  XI ROBOTIC ASSISTED LAPAROSCOPIC HYSTERECTOMY AND SALPINGECTOMY:  Lysis of adhesions  SURGEON:  Dr. Christeen Douglas, MD ASSISTANT: Dr. Thomasene Mohair, MD  Anesthesiologist:  Anesthesiologist: Louie Boston, MD CRNA: Malva Cogan, CRNA; Hezzie Bump, CRNA  INDICATIONS: 37 y.o. F  here for definitive surgical management secondary to the indications listed under preoperative diagnoses; please see preoperative note for further details.  We are anticipating significant scar tissue, as she had been told with her prior laparoscopic surgery that her adhesions were extensive.  Risks of surgery were discussed with the patient including but not limited to: bleeding which may require transfusion or reoperation; infection which may require antibiotics; injury to bowel, bladder, ureters or other surrounding organs; need for additional procedures; thromboembolic phenomenon, incisional problems and other postoperative/anesthesia complications. Written informed consent was obtained.    FINDINGS:    External genitalia, vaginal canal and cervix negative for lesions.  Intraoperative findings revealed a normal upper abdomen including bowel, liver, diaphragmatic surfaces, stomach, and omentum.   The uterus was densely adherent to the anterior abdominal wall.  Omentum was adherent to the right adnexa, at the site of the prior [place] clips.  The right and left ovaries appeared normal.  Bilateral tubes appeared normal.  Appendix wnl   ANESTHESIA:    General INTRAVENOUS FLUIDS:1000  ml ESTIMATED BLOOD LOSS:150 ml URINE OUTPUT: 300 ml  SPECIMENS: Uterus, cervix, bilateral fallopian tubes COMPLICATIONS: None immediate   RATLH/BS:  PROCEDURE IN DETAIL: After informed consent was obtained, the patient was taken to the operating room where  general anesthesia was obtained without difficulty. The patient was positioned in the dorsal lithotomy position in [place] stirrups and her arms were carefully tucked at her sides and the usual precautions were taken. Deep Trendelenburg (20-25 deg) was established to confirm that she does not shift on the table.  She was prepped and draped in normal sterile fashion.  Time-out was performed and a Foley catheter was placed into the bladder. A standard VCare uterine manipulator was then placed in the uterus without incident.  Preoperative prophylactic antibiotics were given through her iv.  After infiltration of local anesthetic at the proposed trocar sites, an 8 mm incision was created at Palmer's point, and an AirSeal 5mm was placed under direct visualization, after confirmation of OG tube working well. Pneumoperitoneum was created to a pressure of 15 mm Hg. The camera was placed and the abdomin surveyed, noting intact bowel below the site of entry. A survey of the pelvis and upper abdomen revealed the above findings. One right and one left lateral 8-mm robotic ports were placed under direct visualization.  The patient was placed in deepTrendelenburg and the bowel was displaced up into the upper abdomen. The robot was left side docked. The instruments were placed under direct visualization.   The ureters were identified bilaterally coursing outside of the operative field.   The anterior uterine adhesion was taken down sharply with scissors to the level of the bladder resection.  This plane was carefully developed down to the level of the colpotomy.  The bladder was not entered.  Round ligaments were divided on each side with the EndoShears and the retroperitoneal space was opened bilaterally. The posterior leaflet of the broad was taken down to the level of the IP ligament. The anterior leaflet of the broad ligament was carefully taken down to the midline.  A bladder flap  was created and the bladder was  dissected down off the cervix using endoshears and electrocautery.   The Fallopian tubes were divided from the ovaries, and care taken to hemostatically transect the utero-ovarian ligament. The peritoneum was taken down to the level of the internal os, and the uterine arteries skeletonized. With strong cephalad pressure from the V-care, bipolar cautery was used to seal and transect the uterine arteries, and the pedicles allowed to fall away laterally.  A colpotomy was performed circumferentially along the V-Care ring with monopolar electrocautery and the cervix was incised from the vagina using the laparoscopic scissors. The specimen was removed through the vagina.  A pneumo balloon was placed in the vagina and the vaginal cuff was then closed in a running continuous fashion using the  0 V-Lock suture with careful attention to include the vaginal cuff angles, the uterosacral ligaments and the vaginal mucosa within the closure.  Hemostasis was secured with intraabdominal pressure and review of all surgical sites. The intraperitoneal pressure was dropped, and all planes of dissection, vascular pedicles and the vaginal cuff were found to be hemostatic.  The ureters were noted to be peristalsing bilaterally to the level of the bladder.  The robot was undocked.   The lateral trocars were removed under visualization.  The CO2 gas was released and several deep breaths given to remove any remaining CO2 from the peritoneal cavity.  The skin incisions were closed with 4-0 Monocryl subcuticular stitch and Dermabond.    Anesthesia was reversed without difficulty.  The patient tolerated the procedure well.  Sponge, lap and needle counts were correct x2.  The patient was taken to recovery room in excellent condition.

## 2023-04-27 NOTE — Anesthesia Procedure Notes (Signed)
Procedure Name: Intubation Date/Time: 04/27/2023 12:10 PM  Performed by: Malva Cogan, CRNAPre-anesthesia Checklist: Patient identified, Patient being monitored, Timeout performed, Emergency Drugs available and Suction available Patient Re-evaluated:Patient Re-evaluated prior to induction Oxygen Delivery Method: Circle system utilized Preoxygenation: Pre-oxygenation with 100% oxygen Induction Type: IV induction Ventilation: Mask ventilation without difficulty Laryngoscope Size: 3 and McGrath Grade View: Grade I Tube type: Oral Tube size: 7.0 mm Number of attempts: 1 Airway Equipment and Method: Stylet and Video-laryngoscopy Placement Confirmation: ETT inserted through vocal cords under direct vision, positive ETCO2 and breath sounds checked- equal and bilateral Secured at: 21 cm Tube secured with: Tape Dental Injury: Teeth and Oropharynx as per pre-operative assessment

## 2023-04-28 ENCOUNTER — Encounter: Payer: Self-pay | Admitting: Obstetrics and Gynecology

## 2023-04-28 LAB — SURGICAL PATHOLOGY

## 2023-04-28 NOTE — Anesthesia Postprocedure Evaluation (Signed)
Anesthesia Post Note  Patient: KLOWIE DEMERY  Procedure(s) Performed: XI ROBOTIC ASSISTED LAPAROSCOPIC HYSTERECTOMY AND SALPINGECTOMY (Bilateral: Uterus)  Patient location during evaluation: PACU Anesthesia Type: General Level of consciousness: awake and alert Pain management: pain level controlled Vital Signs Assessment: post-procedure vital signs reviewed and stable Respiratory status: spontaneous breathing, nonlabored ventilation, respiratory function stable and patient connected to nasal cannula oxygen Cardiovascular status: blood pressure returned to baseline and stable Postop Assessment: no apparent nausea or vomiting Anesthetic complications: no   No notable events documented.   Last Vitals:  Vitals:   04/27/23 1515 04/27/23 1531  BP:  96/67  Pulse: (!) 54 84  Resp: 12 18  Temp: 36.7 C (!) 36.3 C  SpO2: 95% 100%    Last Pain:  Vitals:   04/27/23 1531  TempSrc: Temporal  PainSc: 4                  Louie Boston

## 2023-05-09 ENCOUNTER — Encounter: Payer: Self-pay | Admitting: Obstetrics and Gynecology

## 2023-05-16 ENCOUNTER — Emergency Department: Payer: 59

## 2023-05-16 ENCOUNTER — Other Ambulatory Visit: Payer: Self-pay

## 2023-05-16 DIAGNOSIS — J45909 Unspecified asthma, uncomplicated: Secondary | ICD-10-CM | POA: Diagnosis not present

## 2023-05-16 DIAGNOSIS — Z87891 Personal history of nicotine dependence: Secondary | ICD-10-CM | POA: Insufficient documentation

## 2023-05-16 DIAGNOSIS — N99820 Postprocedural hemorrhage and hematoma of a genitourinary system organ or structure following a genitourinary system procedure: Secondary | ICD-10-CM | POA: Diagnosis not present

## 2023-05-16 DIAGNOSIS — N939 Abnormal uterine and vaginal bleeding, unspecified: Secondary | ICD-10-CM | POA: Diagnosis present

## 2023-05-16 LAB — URINALYSIS, ROUTINE W REFLEX MICROSCOPIC: RBC / HPF: >50 RBC/hpf (ref 0–5)

## 2023-05-16 LAB — BASIC METABOLIC PANEL
Anion gap: 11 (ref 5–15)
BUN: 8 mg/dL (ref 6–20)
CO2: 19 mmol/L — ABNORMAL LOW (ref 22–32)
Calcium: 8.8 mg/dL — ABNORMAL LOW (ref 8.9–10.3)
Chloride: 107 mmol/L (ref 98–111)
Creatinine, Ser: 0.7 mg/dL (ref 0.44–1.00)
GFR, Estimated: >60 mL/min (ref >60)
Glucose, Bld: 166 mg/dL — ABNORMAL HIGH (ref 70–99)
Potassium: 3.6 mmol/L (ref 3.5–5.1)
Sodium: 137 mmol/L (ref 135–145)

## 2023-05-16 LAB — CBC WITH DIFFERENTIAL/PLATELET
Abs Immature Granulocytes: 0.01 10*3/uL (ref 0.00–0.07)
Basophils Absolute: 0.1 10*3/uL (ref 0.0–0.1)
Basophils Relative: 2 %
Eosinophils Absolute: 0.2 10*3/uL (ref 0.0–0.5)
Eosinophils Relative: 3 %
HCT: 38.9 % (ref 36.0–46.0)
Hemoglobin: 13 g/dL (ref 12.0–15.0)
Immature Granulocytes: 0 %
Lymphocytes Relative: 39 %
Lymphs Abs: 2.4 10*3/uL (ref 0.7–4.0)
MCH: 30.7 pg (ref 26.0–34.0)
MCHC: 33.4 g/dL (ref 30.0–36.0)
MCV: 91.7 fL (ref 80.0–100.0)
Monocytes Absolute: 0.4 10*3/uL (ref 0.1–1.0)
Monocytes Relative: 6 %
Neutro Abs: 3.1 10*3/uL (ref 1.7–7.7)
Neutrophils Relative %: 50 %
Platelets: 297 10*3/uL (ref 150–400)
RBC: 4.24 MIL/uL (ref 3.87–5.11)
RDW: 13 % (ref 11.5–15.5)
WBC: 6.1 10*3/uL (ref 4.0–10.5)
nRBC: 0 % (ref 0.0–0.2)

## 2023-05-16 LAB — POC URINE PREG, ED: Preg Test, Ur: NEGATIVE

## 2023-05-16 MED ORDER — IOHEXOL 300 MG/ML  SOLN
100.0000 mL | Freq: Once | INTRAMUSCULAR | Status: AC | PRN
  Administered 2023-05-16: 100 mL via INTRAVENOUS

## 2023-05-16 NOTE — ED Triage Notes (Signed)
 Pt reports hysterectomy on 12/19, pt states today she developed lower right abd pain and began to have heavy vaginal bleeding. Pt denies difficulty urinating, but has had some inc frequency.

## 2023-05-17 ENCOUNTER — Emergency Department
Admission: EM | Admit: 2023-05-17 | Discharge: 2023-05-17 | Disposition: A | Discharge status: Home / Self Care | Payer: 59 | Attending: Emergency Medicine | Admitting: Emergency Medicine

## 2023-05-17 DIAGNOSIS — N99820 Postprocedural hemorrhage and hematoma of a genitourinary system organ or structure following a genitourinary system procedure: Secondary | ICD-10-CM

## 2023-05-17 MED ORDER — MORPHINE SULFATE (PF) 4 MG/ML IV SOLN
4.0000 mg | Freq: Once | INTRAVENOUS | Status: AC
  Administered 2023-05-17: 4 mg via INTRAVENOUS

## 2023-05-17 MED ORDER — MORPHINE SULFATE (PF) 4 MG/ML IV SOLN
4.0000 mg | Freq: Once | INTRAVENOUS | Status: AC
  Administered 2023-05-17: 4 mg via INTRAVENOUS
  Filled 2023-05-17: qty 1

## 2023-05-17 MED ORDER — ONDANSETRON HCL 4 MG/2ML IJ SOLN
4.0000 mg | INTRAMUSCULAR | Status: AC
  Administered 2023-05-17: 4 mg via INTRAVENOUS
  Filled 2023-05-17: qty 2

## 2023-05-17 NOTE — ED Notes (Signed)
 Reviewed D/C information with the patient, pt verbalized understanding. No additional concerns at this time.

## 2023-05-17 NOTE — Discharge Instructions (Addendum)
 Please continue to observe pelvic rest and any other recommendations provided to you by Dr. Verdon.  Her office will reach out to you to schedule a follow-up appointment.  Return immediately to the emergency department if you develop new or worsening symptoms that concern you.

## 2023-05-17 NOTE — ED Provider Notes (Signed)
 [facility] Provider Note    Event Date/Time   First MD Initiated Contact with Patient 05/17/23 [phone]     (approximate)   History   Vaginal Bleeding   HPI Amy Meza is a 38 y.o. female who presents for evaluation of vaginal bleeding in the setting of a hysterectomy performed by Dr. Verdon 3 to 4 weeks ago.  She has been doing fine and in fact had a postoperative follow-up visit with Dr. Verdon about 2 days ago in which everything looked okay.  However, tonight at about 7 PM she developed acute onset heavy vaginal bleeding and has used 2-3 pads since that time (over the last 7 hours or so).  She is also having pelvic pain which she has not had since the surgery.  No fever, nausea, or vomiting.  No trauma or vaginal intercourse.     Physical Exam   Triage Vital Signs: ED Triage Vitals  Encounter Vitals Group     BP 05/16/23 2230 (!) 123/92     Systolic BP Percentile --      Diastolic BP Percentile --      Pulse Rate 05/16/23 2230 89     Resp 05/16/23 2230 18     Temp 05/16/23 2230 98.1 F (36.7 C)     Temp Source 05/16/23 2230 Oral     SpO2 05/16/23 2230 98 %     Weight 05/16/23 2229 72.1 kg (159 lb)     Height 05/16/23 2229 1.651 m (5' 5)     Head Circumference --      Peak Flow --      Pain Score 05/16/23 2229 8     Pain Loc --      Pain Education --      Exclude from Growth Chart --     Most recent vital signs: Vitals:   05/16/23 2230 05/17/23 0543  BP: (!) 123/92 122/89  Pulse: 89 80  Resp: 18 18  Temp: 98.1 F (36.7 C) 98.3 F (36.8 C)  SpO2: 98% 98%    General: Awake, no obvious distress but appears uncomfortable when she moves around. CV:  Good peripheral perfusion.  Regular rate and rhythm Resp:  Normal effort. Speaking easily and comfortably, no accessory muscle usage nor intercostal retractions.   Abd:  No distention.  Tenderness to palpation of the suprapubic region of the abdomen with some guarding. Other:  Deferred  pelvic exam to OB/GYN   ED Results / Procedures / Treatments   Labs (all labs ordered are listed, but only abnormal results are displayed) Labs Reviewed  BASIC METABOLIC PANEL - Abnormal; Notable for the following components:      Result Value   CO2 19 (*)    Glucose, Bld 166 (*)    Calcium 8.8 (*)    All other components within normal limits  URINALYSIS, ROUTINE W REFLEX MICROSCOPIC - Abnormal; Notable for the following components:   Color, Urine RED (*)    APPearance TURBID (*)    Glucose, UA   (*)    Value: TEST NOT REPORTED DUE TO COLOR INTERFERENCE OF URINE PIGMENT   Hgb urine dipstick   (*)    Value: TEST NOT REPORTED DUE TO COLOR INTERFERENCE OF URINE PIGMENT   Bilirubin Urine   (*)    Value: TEST NOT REPORTED DUE TO COLOR INTERFERENCE OF URINE PIGMENT   Ketones, ur   (*)    Value: TEST NOT REPORTED DUE TO COLOR INTERFERENCE OF  URINE PIGMENT   Protein, ur   (*)    Value: TEST NOT REPORTED DUE TO COLOR INTERFERENCE OF URINE PIGMENT   Nitrite   (*)    Value: TEST NOT REPORTED DUE TO COLOR INTERFERENCE OF URINE PIGMENT   Leukocytes,Ua   (*)    Value: TEST NOT REPORTED DUE TO COLOR INTERFERENCE OF URINE PIGMENT   Bacteria, UA FEW (*)    All other components within normal limits  CBC WITH DIFFERENTIAL/PLATELET  POC URINE PREG, ED     RADIOLOGY I viewed and interpreted the patient's CT of the abdomen and pelvis and I see no evidence of acute hemorrhage.  Radiology clarified postsurgical changes but no active for acute hemorrhage visible within the vaginal vault.   PROCEDURES:  Critical Care performed: No  Procedures    IMPRESSION / MDM / ASSESSMENT AND PLAN / ED COURSE  I reviewed the triage vital signs and the nursing notes.                              Differential diagnosis includes, but is not limited to, wound dehiscence, wound infection, laceration/tear.  Patient's presentation is most consistent with acute presentation with potential threat to life or  bodily function.  Labs/studies ordered: CT of the abdomen and pelvis, urine pregnancy test, BMP, CBC with differential, urinalysis  Interventions/Medications given:  Medications  iohexol  (OMNIPAQUE ) 300 MG/ML solution 100 mL (100 mLs Intravenous Contrast Given 05/16/23 2336)  morphine  (PF) 4 MG/ML injection 4 mg (4 mg Intravenous Given 05/17/23 0340)  ondansetron  (ZOFRAN ) injection 4 mg (4 mg Intravenous Given 05/17/23 0340)  morphine  (PF) 4 MG/ML injection 4 mg (4 mg Intravenous Given 05/17/23 0540)    (Note:  hospital course my include additional interventions and/or labs/studies not listed above.)   Concern for wound dehiscence causing the vaginal bleeding, less likely intra-abdominal/pelvic infection.  Labs are all essentially normal including no leukocytosis and no acute anemia.  CT scan is reassuring with no active hemorrhage visible.  I talked with the patient about performing a pelvic exam and she would prefer to wait for OB/GYN evaluation which I think is very reasonable, since if there is a laceration or wound dehiscence that will require surgical intervention.  I will contact [facility] clinic OB/GYN.   Clinical Course as of 05/17/23 0717  Wed May 17, 2023  0352 I consulted Delon Coe, CNM by phone regarding the situation.  She recommended I contact Dr. Verdon directly.  I then consulted with Dr. Verdon by phone and she will come to the emergency department to see the patient. [CF]  [id number] Dr. Verdon consulted on the patient in the emergency department.  No wound dehiscence, some bleeding at 1 side of the cuff.  She feels the patient is appropriate for discharge and close outpatient follow-up.  Patient remains hemodynamically stable.  Patient has pain medication at home.  Will discharge as per Dr. Larae recommendations for clinic follow-up. [CF]    Clinical Course User Index [CF] Gordan Huxley, MD     FINAL CLINICAL IMPRESSION(S) / ED DIAGNOSES   Final diagnoses:   Postoperative vaginal bleeding following genitourinary procedure     Rx / DC Orders   ED Discharge Orders     None        Note:  This document was prepared using Dragon voice recognition software and may include unintentional dictation errors.   Gordan Huxley, MD 05/17/23 [phone]

## 2023-05-17 NOTE — Consult Note (Signed)
 Consult History and Physical   SERVICE: Gynecology   Patient Name: Amy Meza Patient MRN:   [id number]  CC: Postop vaginal bleeding  HPI: Amy Meza is a 38 y.o.  presenting on POD#20 after RATLH/BS with vaginal bleeding and lower abdominal pain.  Her labs are reassuring. Hgb and WBC stable, urine too bloody to assess.  Her imaging shows reassuring abd/pelvis.  Constipation has improved. No dysuria. Pain on right side, started on left, now suprapubic to right side.   Review of Systems: positives in bold GEN:   fevers, chills, weight changes, appetite changes, fatigue, night sweats HEENT:  HA, vision changes, hearing loss, congestion, rhinorrhea, sinus pressure, dysphagia CV:   CP, palpitations PULM:  SOB, cough GI:  abd pain +vaginal bleeding, N/V/D/C GU:  dysuria, urgency, frequency MSK:  arthralgias, myalgias, back pain, swelling SKIN:  rashes, color changes, pallor NEURO:  numbness, weakness, tingling, seizures, dizziness, tremors PSYCH:  depression, anxiety, behavioral problems, confusion  HEME/LYMPH:  easy bruising or bleeding ENDO:  heat/cold intolerance  Past Obstetrical History: OB History   No obstetric history on file.     Past Gynecologic History: Patient's last menstrual period was 03/29/2023 (exact date).   Past Medical History: Past Medical History:  Diagnosis Date   Anemia    B12 deficiency   Anxiety    HAS BEEN OFF MEDS SINCE SEPT 2019   Asthma    Depression    HAS BEEN OFF MEDS SINCE SEPT 2019   GERD (gastroesophageal reflux disease)    OCC-NO MEDS   History of methicillin resistant staphylococcus aureus (MRSA)    AGE 19 OR 19 left arm    Past Surgical History:   Past Surgical History:  Procedure Laterality Date   CESAREAN SECTION     X2   MASS EXCISION Right 06/20/2018   Procedure: EXCISION OF AXILLARY MASS;  Surgeon: Rodolph Romano, MD;  Location: [facility] ORS;  Service: General;  Laterality: Right;   ROBOTIC ASSISTED  LAPAROSCOPIC HYSTERECTOMY AND SALPINGECTOMY Bilateral 04/27/2023   Procedure: XI ROBOTIC ASSISTED LAPAROSCOPIC HYSTERECTOMY AND SALPINGECTOMY;  Surgeon: Verdon Keen, MD;  Location: [facility] ORS;  Service: Gynecology;  Laterality: Bilateral;   TUBAL LIGATION     WISDOM TOOTH EXTRACTION      Family History:  family history is not on file.  Social History:  Social History   Socioeconomic History   Marital status: Married    Spouse name: Not on file   Number of children: Not on file   Years of education: Not on file   Highest education level: Not on file  Occupational History   Not on file  Tobacco Use   Smoking status: Former    Current packs/day: 0.00    Average packs/day: 1 pack/day for 19.0 years (19.0 ttl pk-yrs)    Types: Cigarettes    Start date: 06/18/1997    Quit date: 06/18/2016    Years since quitting: 6.9   Smokeless tobacco: Never  Vaping Use   Vaping status: Former   Quit date: 01/16/2018  Substance and Sexual Activity   Alcohol use: Yes    Comment: RARE   Drug use: Never   Sexual activity: Not on file  Other Topics Concern   Not on file  Social History Narrative   Not on file   Social Drivers of Health   Financial Resource Strain: Low Risk  (03/23/2023)   Received from [facility]   Overall Financial Resource Strain (CARDIA)    Difficulty of  Paying Living Expenses: Not hard at all  Food Insecurity: No Food Insecurity (03/23/2023)   Received from [facility]   Hunger Vital Sign    Worried About Running Out of Food in the Last Year: Never true    Ran Out of Food in the Last Year: Never true  Transportation Needs: No Transportation Needs (03/23/2023)   Received from [facility]    In the past 12 months, has lack of transportation kept you from medical appointments or from getting medications?: No    Lack of Transportation (Non-Medical): No  Physical Activity: Not on file   Stress: Not on file  Social Connections: Not on file  Intimate Partner Violence: Not on file    Home Medications:  Medications reconciled in EPIC  No current facility-administered medications on file prior to encounter.   Current Outpatient Medications on File Prior to Encounter  Medication Sig Dispense Refill   acetaminophen  (TYLENOL ) 325 MG tablet Take 650-975 mg by mouth every 6 (six) hours as needed for moderate pain (pain score 4-6).     budesonide-formoterol (SYMBICORT) 80-4.5 MCG/ACT inhaler Inhale 2 puffs into the lungs 2 (two) times daily.     cetirizine (ZYRTEC) 10 MG tablet Take 10 mg by mouth daily.     docusate sodium  (COLACE) 100 MG capsule Take 1 capsule (100 mg total) by mouth 2 (two) times daily. To keep stools soft 30 capsule 0   EPINEPHrine  (AUVI-Q ) 0.3 mg/0.3 mL IJ SOAJ injection Inject 0.3 mg into the muscle as needed for anaphylaxis.     fluticasone (FLONASE) 50 MCG/ACT nasal spray Place 1 spray into both nostrils daily.     gabapentin  (NEURONTIN ) 800 MG tablet Take 1 tablet (800 mg total) by mouth at bedtime for 14 days. Take nightly for 3 days, then up to 14 days as needed 14 tablet 0   ibuprofen  (ADVIL ) 200 MG tablet Take 400-800 mg by mouth every 6 (six) hours as needed for moderate pain (pain score 4-6).     LYSINE PO Take 3 capsules by mouth daily. Super Lysine Plus     OVER THE COUNTER MEDICATION Take 2 tablets by mouth daily. Iron Blood Builder supplement     oxyCODONE  (OXY IR/ROXICODONE ) 5 MG immediate release tablet Take 1 tablet (5 mg total) by mouth every 4 (four) hours as needed for severe pain (pain score 7-10). 20 tablet 0    Allergies:  Allergies  Allergen Reactions   Alpha-Gal Diarrhea and Nausea And Vomiting   Wasp Venom Anaphylaxis   Latex Rash   Sulfa Antibiotics Rash    Childhood allergy    Physical Exam:  Temp:  [98.1 F (36.7 C)] 98.1 F (36.7 C) (01/07 2230) Pulse Rate:  [89] 89 (01/07 2230) Resp:  [18] 18 (01/07 2230) BP:  (123)/(92) 123/92 (01/07 2230) SpO2:  [98 %] 98 % (01/07 2230) Weight:  [72.1 kg] 72.1 kg (01/07 2229)   General Appearance:  Well developed, well nourished, no acute distress, alert and oriented x3 HEENT:  Normocephalic atraumatic, extraocular movements intact, moist mucous membranes Abdomen:  soft, nondistended, no abnormal masses, no epigastric pain, diffusely tender throughout, with neg McBurneys, negative Murphy's sign, negative rovsing's sign Extremities:  Full range of motion, no pedal edema Skin:  normal coloration and turgor, no rashes, no suspicious skin lesions noted  Neurologic:  Cranial nerves 2-12 grossly intact Psychiatric:  Normal mood and affect, appropriate, no AH/VH Pelvic:  NEFG, no vulvar masses  or lesions, normal vaginal mucosa, + vaginal bleeding but no discharge, vaginal cuff sutures intact without erythema, No red shiny erythematous tissue, no watery drainage from incisions   Labs/Studies:   CBC and Coags:  Lab Results  Component Value Date   WBC 6.1 05/16/2023   NEUTOPHILPCT 50 05/16/2023   EOSPCT 3 05/16/2023   BASOPCT 2 05/16/2023   LYMPHOPCT 39 05/16/2023   HGB 13.0 05/16/2023   HCT 38.9 05/16/2023   MCV 91.7 05/16/2023   PLT 297 05/16/2023   CMP:  Lab Results  Component Value Date   NA 137 05/16/2023   K 3.6 05/16/2023   CL 107 05/16/2023   CO2 19 (L) 05/16/2023   BUN 8 05/16/2023   CREATININE 0.70 05/16/2023    Other Imaging: CT ABDOMEN PELVIS W CONTRAST Result Date: 05/16/2023 CLINICAL DATA:  History of prior hysterectomy with new onset right abdominal pain and heavy vaginal bleeding, initial encounter EXAM: CT ABDOMEN AND PELVIS WITH CONTRAST TECHNIQUE: Multidetector CT imaging of the abdomen and pelvis was performed using the standard protocol following bolus administration of intravenous contrast. RADIATION DOSE REDUCTION: This exam was performed according to the departmental dose-optimization program which includes automated exposure  control, adjustment of the mA and/or kV according to patient size and/or use of iterative reconstruction technique. CONTRAST:  OMNIPAQUE  IOHEXOL  300 MG/ML  SOLN COMPARISON:  None Available. FINDINGS: Lower chest: No acute abnormality. Hepatobiliary: No focal liver abnormality is seen. No gallstones, gallbladder wall thickening, or biliary dilatation. Pancreas: Unremarkable. No pancreatic ductal dilatation or surrounding inflammatory changes. Spleen: Normal in size without focal abnormality. Adrenals/Urinary Tract: Adrenal glands are within normal limits. Kidneys demonstrate a normal enhancement pattern bilaterally. Delayed images demonstrate bilateral excretion of contrast. The bladder is partially distended. Stomach/Bowel: No obstructive or inflammatory changes of the colon are seen. The appendix is within normal limits. Small bowel and stomach are unremarkable. Vascular/Lymphatic: No significant vascular findings are present. No enlarged abdominal or pelvic lymph nodes. Reproductive: Uterus has been surgically removed. No evidence of acute hemorrhage is noted in the vaginal vault. No adnexal mass is noted. Other: No abdominal wall hernia or abnormality. No abdominopelvic ascites. Musculoskeletal: No acute or significant osseous findings. IMPRESSION: Postsurgical changes without acute abnormality. Electronically Signed   By: Oneil Devonshire M.D.   On: 05/16/2023 23:57     Assessment / Plan:   Amy Meza is a 38 y.o. F who presents with sx of postoperative vaginal bleeding POD#20. Labs and imaging reassuring without evidence of intraabdominal abscess or damage to bowels or bladder.  - No evidence of vaginal cuff dehiscence or infection on exam. Active bleeding from right cuff angle not present, but clotting in this area suggests blood vessel in this area to blame. Options to proceed to OR for oversewing this area versus serial exams over the next week discussed.  Plan for repeat exam in 48 hours,  pain medicine PRN.   Precautions re: infection, dehiscence given.

## 2024-03-14 ENCOUNTER — Other Ambulatory Visit: Payer: Self-pay | Admitting: Internal Medicine

## 2024-03-14 DIAGNOSIS — R079 Chest pain, unspecified: Secondary | ICD-10-CM

## 2024-03-14 DIAGNOSIS — R0602 Shortness of breath: Secondary | ICD-10-CM

## 2024-03-14 DIAGNOSIS — Z7689 Persons encountering health services in other specified circumstances: Secondary | ICD-10-CM

## 2024-04-03 ENCOUNTER — Ambulatory Visit
Admission: RE | Admit: 2024-04-03 | Discharge: 2024-04-03 | Disposition: A | Discharge status: Home / Self Care | Payer: Self-pay | Source: Ambulatory Visit | Attending: Internal Medicine | Admitting: Internal Medicine

## 2024-04-03 DIAGNOSIS — R0602 Shortness of breath: Secondary | ICD-10-CM | POA: Insufficient documentation

## 2024-04-03 DIAGNOSIS — R079 Chest pain, unspecified: Secondary | ICD-10-CM | POA: Insufficient documentation

## 2024-04-03 DIAGNOSIS — Z7689 Persons encountering health services in other specified circumstances: Secondary | ICD-10-CM | POA: Insufficient documentation
